# Patient Record
Sex: Female | Born: 1935 | Race: White | Hispanic: No | Marital: Single | Smoking: Never smoker
Health system: Southern US, Community
[De-identification: ages and names within clinical notes are randomized; demographics above are authoritative.]

## PROBLEM LIST (undated history)

## (undated) DIAGNOSIS — E119 Type 2 diabetes mellitus without complications: Secondary | ICD-10-CM

## (undated) DIAGNOSIS — E785 Hyperlipidemia, unspecified: Secondary | ICD-10-CM

## (undated) DIAGNOSIS — I10 Essential (primary) hypertension: Secondary | ICD-10-CM

## (undated) DIAGNOSIS — L409 Psoriasis, unspecified: Secondary | ICD-10-CM

## (undated) HISTORY — PX: CHOLECYSTECTOMY: SHX55

## (undated) HISTORY — PX: ABDOMINAL HYSTERECTOMY: SHX81

## (undated) HISTORY — PX: APPENDECTOMY: SHX54

---

## 2004-09-22 ENCOUNTER — Ambulatory Visit: Payer: Self-pay | Admitting: Internal Medicine

## 2004-11-28 ENCOUNTER — Other Ambulatory Visit: Payer: Self-pay

## 2004-11-28 ENCOUNTER — Emergency Department: Payer: Self-pay | Admitting: Emergency Medicine

## 2005-10-21 ENCOUNTER — Ambulatory Visit: Payer: Self-pay | Admitting: Internal Medicine

## 2006-12-22 ENCOUNTER — Ambulatory Visit: Payer: Self-pay | Admitting: Internal Medicine

## 2007-06-09 ENCOUNTER — Ambulatory Visit: Payer: Self-pay | Admitting: Vascular Surgery

## 2008-01-18 ENCOUNTER — Ambulatory Visit: Payer: Self-pay | Admitting: Internal Medicine

## 2009-01-15 ENCOUNTER — Ambulatory Visit: Payer: Self-pay | Admitting: Ophthalmology

## 2009-01-29 ENCOUNTER — Ambulatory Visit: Payer: Self-pay | Admitting: Ophthalmology

## 2009-03-06 ENCOUNTER — Ambulatory Visit: Payer: Self-pay | Admitting: Internal Medicine

## 2009-03-18 ENCOUNTER — Ambulatory Visit: Payer: Self-pay | Admitting: Ophthalmology

## 2010-05-05 ENCOUNTER — Ambulatory Visit: Payer: Self-pay | Admitting: Internal Medicine

## 2011-06-16 ENCOUNTER — Ambulatory Visit: Payer: Self-pay | Admitting: Internal Medicine

## 2012-12-05 ENCOUNTER — Ambulatory Visit: Payer: Self-pay | Admitting: Internal Medicine

## 2012-12-21 LAB — URINALYSIS, COMPLETE
Blood: NEGATIVE
Nitrite: NEGATIVE
Ph: 6 (ref 4.5–8.0)
Protein: 30
RBC,UR: 1 /HPF (ref 0–5)
WBC UR: <1 /HPF (ref 0–5)

## 2012-12-21 LAB — CBC
HGB: 13.2 g/dL (ref 12.0–16.0)
MCH: 30.1 pg (ref 26.0–34.0)
MCHC: 33.8 g/dL (ref 32.0–36.0)
Platelet: 133 10*3/uL — ABNORMAL LOW (ref 150–440)
RBC: 4.37 10*6/uL (ref 3.80–5.20)
RDW: 14.3 % (ref 11.5–14.5)
WBC: 5.9 10*3/uL (ref 3.6–11.0)

## 2012-12-21 LAB — COMPREHENSIVE METABOLIC PANEL
Alkaline Phosphatase: 84 U/L (ref 50–136)
Anion Gap: 6 — ABNORMAL LOW (ref 7–16)
BUN: 24 mg/dL — ABNORMAL HIGH (ref 7–18)
Calcium, Total: 8.7 mg/dL (ref 8.5–10.1)
Co2: 26 mmol/L (ref 21–32)
Creatinine: 1.51 mg/dL — ABNORMAL HIGH (ref 0.60–1.30)
EGFR (African American): 33 — ABNORMAL LOW
Osmolality: 286 (ref 275–301)
Potassium: 4.2 mmol/L (ref 3.5–5.1)
Sodium: 140 mmol/L (ref 136–145)

## 2012-12-21 LAB — TROPONIN I: Troponin-I: <0.02 ng/mL

## 2012-12-22 LAB — CBC WITH DIFFERENTIAL/PLATELET
Basophil %: 0.5 %
Eosinophil %: 1.6 %
HCT: 35.8 % (ref 35.0–47.0)
HGB: 12.2 g/dL (ref 12.0–16.0)
Lymphocyte #: 0.8 10*3/uL — ABNORMAL LOW (ref 1.0–3.6)
Lymphocyte %: 11.6 %
MCV: 89 fL (ref 80–100)
Monocyte #: 0.3 x10 3/mm (ref 0.2–0.9)
Platelet: 153 10*3/uL (ref 150–440)
RBC: 4.02 10*6/uL (ref 3.80–5.20)

## 2012-12-22 LAB — BASIC METABOLIC PANEL
Calcium, Total: 8.8 mg/dL (ref 8.5–10.1)
Co2: 27 mmol/L (ref 21–32)
Creatinine: 1.6 mg/dL — ABNORMAL HIGH (ref 0.60–1.30)
EGFR (African American): 31 — ABNORMAL LOW
EGFR (Non-African Amer.): 27 — ABNORMAL LOW
Glucose: 268 mg/dL — ABNORMAL HIGH (ref 65–99)
Osmolality: 290 (ref 275–301)

## 2012-12-23 LAB — CBC WITH DIFFERENTIAL/PLATELET
Basophil %: 1.1 %
HCT: 37.7 % (ref 35.0–47.0)
Lymphocyte #: 1.5 10*3/uL (ref 1.0–3.6)
Lymphocyte %: 21.6 %
MCH: 30.4 pg (ref 26.0–34.0)
MCHC: 34.4 g/dL (ref 32.0–36.0)
Monocyte #: 0.6 x10 3/mm (ref 0.2–0.9)
Neutrophil %: 63.7 %
Platelet: 158 10*3/uL (ref 150–440)
RDW: 14.1 % (ref 11.5–14.5)
WBC: 7 10*3/uL (ref 3.6–11.0)

## 2012-12-23 LAB — BASIC METABOLIC PANEL
Anion Gap: 7 (ref 7–16)
BUN: 25 mg/dL — ABNORMAL HIGH (ref 7–18)
Calcium, Total: 9 mg/dL (ref 8.5–10.1)
Creatinine: 1.59 mg/dL — ABNORMAL HIGH (ref 0.60–1.30)
Glucose: 143 mg/dL — ABNORMAL HIGH (ref 65–99)
Potassium: 4 mmol/L (ref 3.5–5.1)
Sodium: 141 mmol/L (ref 136–145)

## 2012-12-23 LAB — URINALYSIS, COMPLETE
Bilirubin,UR: NEGATIVE
Blood: NEGATIVE
Glucose,UR: >=500 mg/dL (ref 0–75)
Leukocyte Esterase: NEGATIVE
Ph: 5 (ref 4.5–8.0)
Protein: NEGATIVE
WBC UR: <1 /HPF (ref 0–5)

## 2012-12-24 ENCOUNTER — Inpatient Hospital Stay: Payer: Self-pay | Admitting: Internal Medicine

## 2012-12-24 LAB — BASIC METABOLIC PANEL
BUN: 29 mg/dL — ABNORMAL HIGH (ref 7–18)
Calcium, Total: 9 mg/dL (ref 8.5–10.1)
Chloride: 104 mmol/L (ref 98–107)
Co2: 30 mmol/L (ref 21–32)
EGFR (Non-African Amer.): 25 — ABNORMAL LOW
Osmolality: 289 (ref 275–301)
Potassium: 3.8 mmol/L (ref 3.5–5.1)

## 2012-12-24 LAB — CBC WITH DIFFERENTIAL/PLATELET
Basophil #: 0.1 10*3/uL (ref 0.0–0.1)
Basophil %: 1 %
Eosinophil #: 0.3 10*3/uL (ref 0.0–0.7)
Lymphocyte #: 1.3 10*3/uL (ref 1.0–3.6)
Lymphocyte %: 16.4 %
MCH: 30 pg (ref 26.0–34.0)
MCV: 88 fL (ref 80–100)
Monocyte #: 0.6 x10 3/mm (ref 0.2–0.9)
Monocyte %: 7.7 %
Neutrophil #: 5.7 10*3/uL (ref 1.4–6.5)

## 2012-12-25 LAB — BASIC METABOLIC PANEL
Anion Gap: 6 — ABNORMAL LOW (ref 7–16)
BUN: 32 mg/dL — ABNORMAL HIGH (ref 7–18)
Calcium, Total: 8.9 mg/dL (ref 8.5–10.1)
Creatinine: 1.86 mg/dL — ABNORMAL HIGH (ref 0.60–1.30)
EGFR (Non-African Amer.): 22 — ABNORMAL LOW
Glucose: 237 mg/dL — ABNORMAL HIGH (ref 65–99)
Osmolality: 287 (ref 275–301)
Potassium: 3.9 mmol/L (ref 3.5–5.1)

## 2012-12-26 ENCOUNTER — Encounter: Payer: Self-pay | Admitting: Internal Medicine

## 2013-01-22 ENCOUNTER — Encounter: Payer: Self-pay | Admitting: Internal Medicine

## 2013-08-13 ENCOUNTER — Ambulatory Visit: Payer: Self-pay | Admitting: Internal Medicine

## 2013-08-22 ENCOUNTER — Ambulatory Visit: Payer: Self-pay | Admitting: Internal Medicine

## 2013-09-21 ENCOUNTER — Ambulatory Visit: Payer: Self-pay | Admitting: Internal Medicine

## 2014-09-13 NOTE — Discharge Summary (Signed)
PATIENT NAME:  Morgan Howard, Morgan Howard MR#:  [id number] DATE OF BIRTH:  1915-01-05  DATE OF ADMISSION:  12/24/2012 DATE OF DISCHARGE:  12/26/2012   DISCHARGE DIAGNOSES:  1.  Acute vertigo.  2.  Malignant hypertension.  3.  Acute on chronic renal failure. 4.  Type 2 diabetes.  5.  Weakness.   CHIEF COMPLAINT: Dizziness associated with weakness and the patient fell back onto her bed.   HISTORY OF PRESENT ILLNESS: Morgan Howard is a 81 year old female, who presents to the ED complaining of feeling dizzy. She states that her head was spinning and she fell back onto the bed. She called her friend, who brought her to the Emergency Room. She continued to feel dizzy associated with nausea and vomiting. The patient's blood pressure on arrival to the ED was noted to be elevated at 221/105.   PAST MEDICAL HISTORY: Significant for type 2 diabetes, vertigo, hernia, hypertension, history of hepatitis B and chronic renal insufficiency.   PHYSICAL EXAMINATION:  VITAL SIGNS: Temperature 97.7, pulse 80, respirations 18, blood pressure 159/75, pulse oximetry 96% on room air.  GENERAL: She was not in respiratory distress.  HEENT: Pupils were equal and reactive to light.  NECK: No JVD.  HEART: S1, S2. A 2/6 systolic ejection murmur.  LUNGS: Clear.  ABDOMEN: Soft, nontender.  EXTREMITIES: No edema. Evidence of any petechial-looking rash noted to the left arm and left leg.   NEUROLOGIC: Nonfocal.   CT of the head was negative. WBC count 5.9, hemoglobin 13.2, hematocrit 38.9, platelets 133. Glucose 149, BUN 24, creatinine 1.51, sodium 140, potassium 4.2, chloride 108, CO2 26, calcium 8.7. LFTs were normal. Troponins were negative. GFR was 29. EKG shows sinus rhythm with PACs.   HOSPITAL COURSE: The patient was [facility]. She was started on low-dose Antivert. During her stay in the hospital, she did have an elevated blood sugars and she also had episodic dizziness, especially when she stood up and was noted  to be posturally hypotensive. She did have some worsening renal function with creatinine going up to 1.86, but overall appeared to be clinically stable and it was felt that she would benefit from rehab and she was discharged in stable condition to rehab on the following medications.   DISCHARGE MEDICATIONS: Amlodipine 5 mg once a day, Sitagliptin 50 mg once a day, meclizine 12.5 mg every 8 hours p.Howard.n.,  glipizide 5 mg p.o. b.i.d., doxazosin 2 mg once a day, aspirin 81 mg a day and Tylenol 325 mg q.4 to 6 hours p.Howard.n.   TOTAL TIME SPENT IN DISCHARGING THIS PATIENT: 35 minutes.  ____________________________ Barbette ReichmannVishwanath Wilhelmina Hark, MD vh:aw D: 12/25/2012 08:29:41 ET T: 12/25/2012 08:51:01 ET JOB#: [id number]  cc: Barbette ReichmannVishwanath Jasaiah Karwowski, MD, <Dictator> Barbette ReichmannVISHWANATH Akacia Boltz MD ELECTRONICALLY SIGNED 01/01/2013 18:39

## 2014-09-13 NOTE — H&P (Signed)
PATIENT NAME:  Morgan Howard, Morgan Howard MR#:  [id number] DATE OF BIRTH:  [birth year]  DATE OF ADMISSION:  12/21/2012  PRIMARY CARE PHYSICIAN:  Barbette Reichmann, MD  CHIEF COMPLAINT: When she got up this morning room was spinning and she fell back into the bed.   HISTORY OF PRESENT ILLNESS: This is a 81 year old female who lives alone, history of vertigo. This morning she woke up, tried to get up, felt very dizzy, the room was spinning and she fell back into the bed. She called her friend and brought her here. She is still feeling dizzy. She did have some nausea and vomiting this morning.  In the ER, as per the ER physician, was unable to get up and walk. Since she lives alone, it was advised that we brought her in for an observation. In the ER, she had a CT scan of the head that was negative, found to have a platelet count of 133 and a creatinine elevated at 1.51.  The patient's blood pressure was found to be elevated on presentation at 221/105. Hospitalist services were contacted for further evaluation.   PAST MEDICAL HISTORY: Diabetes, vertigo, hernia, hypertension, hepatitis B, rash on the arm.   PAST SURGICAL HISTORY: Cataract, hysterectomy, tonsils, appendix.   ALLERGIES: ASPIRIN, HIGH DOSE LIPITOR, NOVOCAIN, PENICILLIN, STATIN, SULFA.   MEDICATIONS: As per prescription writer include: 1.  Aspirin 81 mg daily. 2.  Ciclopirox 8% topical solution apply to effected area daily. 3.  Cyclobenzaprine 5 mg at bedtime as needed.  4.  Doxazosin 2 mg daily. 5.  Glipizide 10 mg twice a day. 6.  Dyazide 25/37.5 mg 1 tablet daily. 7.  Januvia 100 mg daily. 8.  Metformin 500 mg 2 tablets twice a day.   SOCIAL HISTORY: Lives alone. No alcohol. No smoking. No drug use. 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  FAMILY HISTORY: Father died at 109 of heart disease. Mother died at 33 of a CVA, also had hypertension.  REVIEW OF SYSTEMS: CONSTITUTIONAL: Positive for  sweats. No fever or chills. Positive for fatigue.  EYES: She has 1 good eye and she does wear reading glasses.  EARS, NOSE, MOUTH, AND THROAT: Decreased hearing. Positive for runny nose. No sore throat. No difficulty swallowing.  CARDIOVASCULAR: Occasional chest pain. No palpitations.  RESPIRATORY: No shortness of breath, but a chronic cough. No sputum. No hemoptysis.  GASTROINTESTINAL: Positive for nausea and vomiting this morning. Some lower abdominal pain. Occasional diarrhea. She does see bright red blood occasionally with hemorrhoids. GENITOURINARY: Occasional burning on urination.  INTEGUMENT: Has this rash on various places, petechial looking.  NEUROLOGIC: No fainting or blackouts, but dizziness today and vertigo.  PSYCHIATRIC: Positive for anxiety.  ENDOCRINE: No thyroid problems.  HEMATOLOGIC/LYMPHATIC: No anemia, but thrombocytopenia noted on labs today.   PHYSICAL EXAMINATION:  VITAL SIGNS: Current include a temperature of 97.7, pulse 80, respirations 18, blood pressure 159/75 and pulse ox 96% on room air. Blood pressure on presentation with the acute vertigo was 221/105.  GENERAL: No respiratory distress.  EYES: Conjunctivae and lids normal. Pupils equal, round and reactive to light. Extraocular muscles intact. No nystagmus.  EARS, NOSE, MOUTH AND THROAT: Tympanic membranes: No erythema. Nasal mucosa: No erythema. Throat:  No erythema. no exudate seen. Lips and gums: No lesions.  NECK: No JVD. No bruits. No lymphadenopathy. No thyromegaly. No thyroid nodules palpated.  LUNGS: Clear to auscultation. No use of accessory muscles to breathe. No rhonchi, rales or wheeze  heard.  HEART: S1 and  S2 normal. Positive 2/6 systolic ejection murmur. Carotid upstroke 2+ bilaterally. No bruits.  EXTREMITIES: Dorsalis pedis pulses 2+ bilaterally. No edema of the lower extremities.  ABDOMEN: Soft, nontender. No organosplenomegaly. Normoactive bowel sounds. No masses felt.  LYMPHATIC: No lymph nodes  in the neck.  MUSCULOSKELETAL: No clubbing, edema or cyanosis.  SKIN: No ulcers seen. Positive for rash, left arm and left leg, petechial-looking rash.  NEUROLOGICAL: Cranial nerves II through XII grossly intact. Deep tendon reflexes 2+ bilateral lower extremities. Power 5/5 in upper and lower extremities.  PSYCHIATRIC: The patient is oriented to person, place and time.   LABORATORY AND DIAGNOSTICS: Urinalysis:  30 mg/dL protein, 528150 mg/dL glucose.   CT scan of the head: Negative.   White blood cell count 5.9, H and H 13.2 and 38.9, platelet count 133. Glucose 149, BUN 24, creatinine 1.51, sodium 140, potassium 4.2, chloride 108, CO2 26, calcium 8.7. Liver function tests normal range. GFR 29. Troponin negative.   EKG: Sinus rhythm, premature atrial complexes.   ASSESSMENT AND PLAN: 1.  Acute vertigo with room spinning. Will give gentle IV fluid, get physical therapy evaluation, low-dose Antivert around-the-clock.  Will get a physical therapy evaluation to ensure that she is safe upon going home. We will admit as an observation.  2.  Acute renal failure versus chronic kidney disease. No prior creatinine to go by. I will give gentle IV fluid hydration. Glucophage is contraindicated. With a GFR of 29 will stop that medication. Will check a creatinine in the a.m.  3.  Malignant hypertension.  Will continue usual medications and continue to monitor. Likely was elevated secondary to the vertigo upon presentation.  4.  Diabetes.  Need to renally dose Januvia down to 50 mg daily. I will decrease the glipizide to 5 mg twice a day and DC metformin.  5.  Thrombocytopenia. Unclear if this is new. I will check a hepatitis C.  6.  Petechial type looking rash on the arms.  I am wondering if this could be secondary to the thrombocytopenia or possibly a leukoclastic vasculitis. I will send off a hepatitis C, cryoglobulin, ANA, sedimentation rate, and continue to monitor. The patient is on a solution as per the  dermatologist. May end up needing an outpatient biopsy/  CODE STATUS: The patient is a DNR.   TIME SPENT ON ADMISSION: 55 minutes.   ___________________________ Herschell Dimesichard J. Renae GlossWieting, MD rjw:sb D: 12/21/2012 12:48:35 ET T: 12/21/2012 12:59:23 ET JOB#: [id number]  cc: Herschell Dimesichard J. Renae GlossWieting, MD, <Dictator> Barbette ReichmannVishwanath Hande, MD Salley ScarletICHARD J Reizel Calzada MD ELECTRONICALLY SIGNED 01/05/2013 16:32

## 2014-09-13 NOTE — Discharge Summary (Signed)
PATIENT NAME:  Morgan Howard, Morgan Howard MR#:  [id number] DATE OF BIRTH:  July 30, 1914  DATE OF ADMISSION:  12/21/12 DATE OF DISCHARGE: 12/26/12  ADDENDUM:    The patient is the patient was [facility] in [facility].   DISCHARGE MEDICATIONS: As above.    ____________________________ Barbette ReichmannVishwanath Burwell Bethel, MD vh:cc D: 12/26/2012 15:27:00 ET T: 12/26/2012 16:20:31 ET JOB#: [id number]  cc: Barbette ReichmannVishwanath Gordan Grell, MD, <Dictator> Barbette ReichmannVISHWANATH Skylar Flynt MD ELECTRONICALLY SIGNED 01/01/2013 18:45

## 2016-01-01 ENCOUNTER — Observation Stay
Admission: EM | Admit: 2016-01-01 | Discharge: 2016-01-02 | Disposition: A | Discharge status: Home / Self Care | Payer: Medicare Other | Attending: Internal Medicine | Admitting: Internal Medicine

## 2016-01-01 ENCOUNTER — Encounter: Payer: Self-pay | Admitting: Emergency Medicine

## 2016-01-01 ENCOUNTER — Emergency Department: Payer: Medicare Other

## 2016-01-01 DIAGNOSIS — E11649 Type 2 diabetes mellitus with hypoglycemia without coma: Secondary | ICD-10-CM | POA: Diagnosis not present

## 2016-01-01 DIAGNOSIS — Z880 Allergy status to penicillin: Secondary | ICD-10-CM | POA: Insufficient documentation

## 2016-01-01 DIAGNOSIS — Z794 Long term (current) use of insulin: Secondary | ICD-10-CM | POA: Insufficient documentation

## 2016-01-01 DIAGNOSIS — E118 Type 2 diabetes mellitus with unspecified complications: Secondary | ICD-10-CM

## 2016-01-01 DIAGNOSIS — E119 Type 2 diabetes mellitus without complications: Secondary | ICD-10-CM

## 2016-01-01 DIAGNOSIS — Z7982 Long term (current) use of aspirin: Secondary | ICD-10-CM | POA: Diagnosis not present

## 2016-01-01 DIAGNOSIS — I700 Atherosclerosis of aorta: Secondary | ICD-10-CM | POA: Insufficient documentation

## 2016-01-01 DIAGNOSIS — E785 Hyperlipidemia, unspecified: Secondary | ICD-10-CM | POA: Insufficient documentation

## 2016-01-01 DIAGNOSIS — I10 Essential (primary) hypertension: Secondary | ICD-10-CM | POA: Insufficient documentation

## 2016-01-01 DIAGNOSIS — E162 Hypoglycemia, unspecified: Secondary | ICD-10-CM | POA: Diagnosis present

## 2016-01-01 DIAGNOSIS — L409 Psoriasis, unspecified: Secondary | ICD-10-CM | POA: Insufficient documentation

## 2016-01-01 DIAGNOSIS — R1111 Vomiting without nausea: Secondary | ICD-10-CM | POA: Diagnosis present

## 2016-01-01 DIAGNOSIS — K219 Gastro-esophageal reflux disease without esophagitis: Secondary | ICD-10-CM | POA: Diagnosis not present

## 2016-01-01 DIAGNOSIS — R1110 Vomiting, unspecified: Secondary | ICD-10-CM | POA: Diagnosis present

## 2016-01-01 DIAGNOSIS — R112 Nausea with vomiting, unspecified: Secondary | ICD-10-CM | POA: Diagnosis present

## 2016-01-01 DIAGNOSIS — G319 Degenerative disease of nervous system, unspecified: Secondary | ICD-10-CM | POA: Insufficient documentation

## 2016-01-01 DIAGNOSIS — Z66 Do not resuscitate: Secondary | ICD-10-CM | POA: Insufficient documentation

## 2016-01-01 HISTORY — DX: Essential (primary) hypertension: I10

## 2016-01-01 HISTORY — DX: Psoriasis, unspecified: L40.9

## 2016-01-01 HISTORY — DX: Hyperlipidemia, unspecified: E78.5

## 2016-01-01 HISTORY — DX: Type 2 diabetes mellitus without complications: E11.9

## 2016-01-01 LAB — COMPREHENSIVE METABOLIC PANEL
ALBUMIN: 4.2 g/dL (ref 3.5–5.0)
ALK PHOS: 76 U/L (ref 38–126)
ALT: 9 U/L — AB (ref 14–54)
ANION GAP: 9 (ref 5–15)
AST: 17 U/L (ref 15–41)
BILIRUBIN TOTAL: 0.4 mg/dL (ref 0.3–1.2)
BUN: 21 mg/dL — ABNORMAL HIGH (ref 6–20)
CALCIUM: 9.2 mg/dL (ref 8.9–10.3)
CO2: 23 mmol/L (ref 22–32)
CREATININE: 1.47 mg/dL — AB (ref 0.44–1.00)
Chloride: 109 mmol/L (ref 101–111)
GFR calc non Af Amer: 28 mL/min — ABNORMAL LOW (ref >60)
GFR, EST AFRICAN AMERICAN: 33 mL/min — AB (ref >60)
GLUCOSE: 89 mg/dL (ref 65–99)
Potassium: 4.4 mmol/L (ref 3.5–5.1)
Sodium: 141 mmol/L (ref 135–145)
TOTAL PROTEIN: 7.5 g/dL (ref 6.5–8.1)

## 2016-01-01 LAB — URINALYSIS COMPLETE WITH MICROSCOPIC (ARMC ONLY)
BACTERIA UA: NONE SEEN
Bilirubin Urine: NEGATIVE
Glucose, UA: 50 mg/dL — AB
Ketones, ur: NEGATIVE mg/dL
LEUKOCYTES UA: NEGATIVE
Nitrite: NEGATIVE
PH: 5 (ref 5.0–8.0)
PROTEIN: 100 mg/dL — AB
Specific Gravity, Urine: 1.016 (ref 1.005–1.030)
WBC UA: NONE SEEN WBC/hpf (ref 0–5)

## 2016-01-01 LAB — CBC
HCT: 37 % (ref 35.0–47.0)
HEMOGLOBIN: 12.4 g/dL (ref 12.0–16.0)
MCH: 29.7 pg (ref 26.0–34.0)
MCHC: 33.5 g/dL (ref 32.0–36.0)
MCV: 88.7 fL (ref 80.0–100.0)
Platelets: 171 10*3/uL (ref 150–440)
RBC: 4.17 MIL/uL (ref 3.80–5.20)
RDW: 14.3 % (ref 11.5–14.5)
WBC: 8.9 10*3/uL (ref 3.6–11.0)

## 2016-01-01 LAB — GLUCOSE, CAPILLARY
GLUCOSE-CAPILLARY: 81 mg/dL (ref 65–99)
Glucose-Capillary: 110 mg/dL — ABNORMAL HIGH (ref 65–99)

## 2016-01-01 LAB — TROPONIN I

## 2016-01-01 LAB — LIPASE, BLOOD: Lipase: 56 U/L — ABNORMAL HIGH (ref 11–51)

## 2016-01-01 MED ORDER — LABETALOL HCL 5 MG/ML IV SOLN
5.0000 mg | Freq: Once | INTRAVENOUS | Status: AC
  Administered 2016-01-01: 5 mg via INTRAVENOUS
  Filled 2016-01-01: qty 4

## 2016-01-01 MED ORDER — ONDANSETRON HCL 4 MG/2ML IJ SOLN
4.0000 mg | Freq: Once | INTRAMUSCULAR | Status: AC
  Administered 2016-01-01: 4 mg via INTRAVENOUS

## 2016-01-01 MED ORDER — ONDANSETRON HCL 4 MG/2ML IJ SOLN
4.0000 mg | Freq: Once | INTRAMUSCULAR | Status: AC
  Administered 2016-01-01: 4 mg via INTRAVENOUS
  Filled 2016-01-01: qty 2

## 2016-01-01 NOTE — ED Notes (Signed)
Pt alert.  Pt states feeling better. Skin warm and dry.  Pt talkative.  Sinus on monitor.

## 2016-01-01 NOTE — ED Notes (Signed)
Pt vomited large amount of emesis.  meds given.  md aware.

## 2016-01-01 NOTE — ED Provider Notes (Signed)
[facility] Emergency Department Provider Note ____________________________________________  Time seen: I have reviewed the triage vital signs and the triage nursing note.  HISTORY  Chief Complaint Hypoglycemia   Historian Patient and one of her caregivers, a lifelong friend who cares for her along with the caregiver's daughter, and stays with this patient many nights  HPI Morgan Howard is a 82 y.o. female with a history of hypertension, and diabetes, for which takes Januvia pill, and reportedly take Humalog 10 unitstwice a day, as well as Lantus 26 units at night, is here for hyperglycemia.  The caregiver's daughter who stays with this patient during the day stated that she did not eat much for lunch and the patient did receive her 10 unitsof Humalogaround 2:30 when she left. When this caregiver showed up around 4, the patient was complaining of feeling jittery and not well and when the blood sugar was checked it was in the 50s. The patient was given orange juice followed by Peanut butter crackers, and her blood sugar did come up to 160.  The patient states she is still a little bit jittery here, but there is no confusion or altered mental status. There is been no chest pain. There is been no cough. No vomiting or diarrhea. No abdominal pain. Caregiver states that she offered tomato soup and she did not eat that. They called the primary care physcian's office, Dr. Marcello Fennel, who recommended Lantus 20 units tonight, instead of 26 units.   History reviewed. No pertinent past medical history.iabetes, hypertension  There are no active problems to display for this patient.   History reviewed. No pertinent surgical history.  Prior to Admission medications   Not on File  aspirin, losartan, Januvia, Humalog, Lantus  No Known Allergies  No family history on file.  Social History Social History  Substance Use Topics  . Smoking status: Never Smoker  . Smokeless  tobacco: Never Used  . Alcohol use No    Review of Systems  Constitutional: Negative for fever. Eyes: Negative for visual changes. ENT: Negative for sore throat. Cardiovascular: Negative for chest pain. Respiratory: Negative for shortness of breath. Gastrointestinal: Negative for abdominal pain, vomiting and diarrhea. Genitourinary: Questionable dysuria. Musculoskeletal: Negative for back pain. Skin: Negative for rash. Neurological: Negative for headache, seizure, confusion, weakness, numbness 10 point Review of Systems otherwise negative ____________________________________________   PHYSICAL EXAM:  VITAL SIGNS: ED Triage Vitals  Enc Vitals Group     BP 01/01/16 1822 138/60     Pulse Rate 01/01/16 1822 (!) 13     Resp 01/01/16 1822 20     Temp 01/01/16 1822 97.8 F (36.6 C)     Temp Source 01/01/16 1822 Oral     SpO2 01/01/16 1822 97 %     Weight 01/01/16 1816 135 lb (61.2 kg)     Height 01/01/16 1816  (1.473 m)     Head Circumference --      Peak Flow --      Pain Score --      Pain Loc --      Pain Edu? --      Excl. in GC? --      Constitutional: Alert and oriented. Well appearing and in no distress. HEENT   Head: Normocephalic and atraumatic.      Eyes: Conjunctivae are normal. PERRL. Normal extraocular movements.      Ears:         Nose: No congestion/rhinnorhea.   Mouth/Throat: Mucous membranes  are moist.   Neck: No stridor. Cardiovascular/Chest: Normal rate, regular rhythm.  No murmurs, rubs, or gallops. Respiratory: Normal respiratory effort without tachypnea nor retractions. Breath sounds are clear and equal bilaterally. No wheezes/rales/rhonchi. Gastrointestinal: Soft. No distention, no guarding, no rebound. Nontender.   Genitourinary/rectal:Deferred Musculoskeletal: Nontender with normal range of motion in all extremities. No joint effusions.  No lower extremity tenderness.  No edema. Neurologic:  Normal speech and language. No gross  or focal neurologic deficits are appreciated. Skin:  Skin is warm, dry and intact. No rash noted. Psychiatric: Mood and affect are normal. Speech and behavior are normal. Patient exhibits appropriate insight and judgment.  ____________________________________________   EKG I, Governor Rooks, MD, the attending physician have personally viewed and interpreted all ECGs.  91 bpm. Sinus rhythm with PACs. Narrow QRS. Normal axis. Normal ST and T-wave ____________________________________________  LABS (pertinent positives/negatives)  Labs Reviewed  LIPASE, BLOOD - Abnormal; Notable for the following:       Result Value   Lipase 56 (*)    All other components within normal limits  COMPREHENSIVE METABOLIC PANEL - Abnormal; Notable for the following:    BUN 21 (*)    Creatinine, Ser 1.47 (*)    ALT 9 (*)    GFR calc non Af Amer 28 (*)    GFR calc Af Amer 33 (*)    All other components within normal limits  GLUCOSE, CAPILLARY - Abnormal; Notable for the following:    Glucose-Capillary 110 (*)    All other components within normal limits  GLUCOSE, CAPILLARY  CBC  URINALYSIS COMPLETEWITH MICROSCOPIC ([facility] ONLY)  TROPONIN I  CBG MONITORING, ED    ____________________________________________  RADIOLOGY All Xrays were viewed by me. Imaging interpreted by Radiologist.  Chest portable xray: IMPRESSION: No acute disease.  Atherosclerosis.  CT head without contrast:  IMPRESSION: No acute abnormality.  Atrophy and chronic microvascular ischemic change.   Abdomen xray: Evaluated by me, awaiting radiologist official report: Nonobstructive bowel gas pattern. __________________________________________  PROCEDURES  Procedure(s) performed: None  Critical Care performed: None  ____________________________________________   ED COURSE / ASSESSMENT AND PLAN  Pertinent labs & imaging results that were available during my care of the patient were reviewed by me and considered in my  medical decision making (see chart for details).   Ms. Wible is here after symptomatic hypoglycemic episode this afternoon. It sounds perhaps the onset was the fact that she did not eat well this afternoon. She is not denying neurologic symptoms, headache, fever, abdominal pain, and until she vomited here in the ER, They did not state that she had several episodes of emesis at home. Nonbloody and nonbilious emesis. Denies diarrhea.   However, after large emesis here, I'm suspicious that at minimum she may have a virus that's giving her decreased by mouth intake with resultant hypoglycemia, but I am going to broaden the evaluation to include a head CT given her elevated blood pressures here despite reporting that she did not miss her morning blood pressure medication.  I am also going to add on EKG, chest x-ray, troponin, and urinalysis which has not been obtained yet.  Additional laboratory studies show no abnormalities on EKG, negative troponin, negative chest x-ray, negative head CT, and no evidence of urinary tract infection.  However, given multiple episodes of vomiting in this elderly patient with an episode of hypoglycemia, I am quite concerned that she will not be able to keep food down and will become hypoglycemic with symptoms again. Patient needs  observation overnight.  She just had one episode of emesis here and has nontender abdomen. At this point I am not concerned obstruction, or other acute intra-abdominal emergency.  My view of abdomen x-ray is nonspecific, no high concern for something that would require additional imaging. Radiologist report is pending at time of hospitalist consultation for admission.    CONSULTATIONS:   Hospitalist for admission.   Patient / Family / Caregiver informed of clinical course, medical decision-making process, and agree with plan.   ___________________________________________   FINAL CLINICAL IMPRESSION(S) / ED DIAGNOSES   Final  diagnoses:  Hypoglycemia  Non-intractable vomiting without nausea, vomiting of unspecified type              Note: This dictation was prepared with Dragon dictation. Any transcriptional errors that result from this process are unintentional    Governor Rooksebecca Latoya Diskin, MD 01/01/16 2055

## 2016-01-01 NOTE — ED Triage Notes (Signed)
Pt presents with low blood sugar today. pts cbg was 53 earlier today and caregiver gave OJ, gingerale, crackers and peanut butter. Pt a/ox3 in triage. cbg in triage was 81.

## 2016-01-01 NOTE — ED Notes (Signed)
fsbs 110  md aware.  Friend with pt.

## 2016-01-01 NOTE — H&P (Signed)
[facility] - [place] at [facility]   PATIENT NAME: Morgan Howard    MR#:  [id number]  DATE OF BIRTH:  Jul 13, 1914  DATE OF ADMISSION:  01/01/2016  PRIMARY CARE PHYSICIAN: Barbette Reichmann, MD   REQUESTING/REFERRING PHYSICIAN: Shaune Pollack, MD  CHIEF COMPLAINT:   Chief Complaint  Patient presents with  . Hypoglycemia    HISTORY OF PRESENT ILLNESS:  Morgan Howard  is a 82 y.o. female who presents with Progressive hypoglycemia followed by significant nausea and vomiting. Patient states that her blood sugar dropped to the 50s at home today. She had never had an episode of hypoglycemia like this before. She had significant associated symptoms which she described as some diaphoresis and "the shakes".  She was treated by EMS and her blood sugar improved. However, she had persistent nausea with vomiting even here in the ED after antiemetic administration. Hospitals were called for admission for observation.  PAST MEDICAL HISTORY:   Past Medical History:  Diagnosis Date  . Diabetes (HCC)   . HLD (hyperlipidemia)   . HTN (hypertension)   . Psoriasis     PAST SURGICAL HISTORY:   Past Surgical History:  Procedure Laterality Date  . ABDOMINAL HYSTERECTOMY    . APPENDECTOMY    . CHOLECYSTECTOMY      SOCIAL HISTORY:   Social History  Substance Use Topics  . Smoking status: Never Smoker  . Smokeless tobacco: Never Used  . Alcohol use No    FAMILY HISTORY:   Family History  Problem Relation Age of Onset  . Stroke Mother   . Heart attack Father   . Breast cancer Sister     DRUG ALLERGIES:   Allergies  Allergen Reactions  . Penicillins     Has patient had a PCN reaction causing immediate rash, facial/tongue/throat swelling, SOB or lightheadedness with hypotension: No Has patient had a PCN reaction causing severe rash involving mucus membranes or skin necrosis: No Has patient had a PCN reaction that required hospitalization No Has patient had a PCN  reaction occurring within the last 10 years: No If all of the above answers are "NO", then may proceed with Cephalosporin use.    MEDICATIONS AT HOME:   Prior to Admission medications   Medication Sig Start Date End Date Taking? Authorizing Provider  acetaminophen (TYLENOL) 500 MG tablet Take 500 mg by mouth daily.   Yes Historical Provider, MD  aspirin EC 81 MG tablet Take 81 mg by mouth daily.   Yes Historical Provider, MD  Cyanocobalamin 1500 MCG TBDP Take 1,500 mcg by mouth daily.   Yes Historical Provider, MD  docusate sodium (COLACE) 50 MG capsule Take 50 mg by mouth daily.   Yes Historical Provider, MD  doxazosin (CARDURA) 2 MG tablet Take 2 mg by mouth daily.   Yes Historical Provider, MD  ferrous sulfate 325 (65 FE) MG tablet Take 325 mg by mouth daily with breakfast.   Yes Historical Provider, MD  Insulin Glargine (LANTUS SOLOSTAR) 100 UNIT/ML Solostar Pen Inject 26 Units into the skin at bedtime.   Yes Historical Provider, MD  insulin lispro (HUMALOG) 100 UNIT/ML KiwkPen Inject 10 Units into the skin 2 (two) times daily with a meal.   Yes Historical Provider, MD  losartan (COZAAR) 25 MG tablet Take 25 mg by mouth daily.   Yes Historical Provider, MD  omeprazole (PRILOSEC) 20 MG capsule Take 20 mg by mouth daily.   Yes Historical Provider, MD  rOPINIRole (REQUIP) 0.5 MG tablet Take 0.5 mg by  mouth 2 (two) times daily.   Yes Historical Provider, MD  sitaGLIPtin (JANUVIA) 25 MG tablet Take 25 mg by mouth daily.   Yes Historical Provider, MD    REVIEW OF SYSTEMS:  Review of Systems  Constitutional: Negative for chills, fever, malaise/fatigue and weight loss.  HENT: Negative for ear pain, hearing loss and tinnitus.   Eyes: Negative for blurred vision, double vision, pain and redness.  Respiratory: Negative for cough, hemoptysis and shortness of breath.   Cardiovascular: Negative for chest pain, palpitations, orthopnea and leg swelling.  Gastrointestinal: Positive for nausea and  vomiting. Negative for abdominal pain, constipation and diarrhea.  Genitourinary: Negative for dysuria, frequency and hematuria.  Musculoskeletal: Negative for back pain, joint pain and neck pain.  Skin:       No acne, rash, or lesions  Neurological: Negative for dizziness, tremors, focal weakness and weakness.  Endo/Heme/Allergies: Negative for polydipsia. Does not bruise/bleed easily.  Psychiatric/Behavioral: Negative for depression. The patient is not nervous/anxious and does not have insomnia.      VITAL SIGNS:   Vitals:   01/01/16 2030 01/01/16 2045 01/01/16 2130 01/01/16 2205  BP: (!) 146/81  (!) 210/89 (!) 194/89  Pulse: (!) 106 (!) 102  (!) 109  Resp: (!) 23 16 (!) 23 (!) 23  Temp:      TempSrc:      SpO2: 98% 99%  98%  Weight:      Height:       Wt Readings from Last 3 Encounters:  01/01/16 61.2 kg (135 lb)    PHYSICAL EXAMINATION:  Physical Exam  Vitals reviewed. Constitutional: She is oriented to person, place, and time. She appears well-developed and well-nourished. No distress.  HENT:  Head: Normocephalic and atraumatic.  Mouth/Throat: Oropharynx is clear and moist.  Eyes: Conjunctivae and EOM are normal. Pupils are equal, round, and reactive to light. No scleral icterus.  Neck: Normal range of motion. Neck supple. No JVD present. No thyromegaly present.  Cardiovascular: Normal rate and intact distal pulses.  Exam reveals no gallop and no friction rub.   No murmur heard. Irregular rhythm  Respiratory: Effort normal and breath sounds normal. No respiratory distress. She has no wheezes. She has no rales.  GI: Soft. Bowel sounds are normal. She exhibits no distension. There is no tenderness.  Musculoskeletal: Normal range of motion. She exhibits no edema.  No arthritis, no gout  Lymphadenopathy:    She has no cervical adenopathy.  Neurological: She is alert and oriented to person, place, and time. No cranial nerve deficit.  No dysarthria, no aphasia  Skin:  Skin is warm and dry. No rash noted. No erythema.  Psychiatric: She has a normal mood and affect. Her behavior is normal. Judgment and thought content normal.    LABORATORY PANEL:   CBC  Recent Labs Lab 01/01/16 1821  WBC 8.9  HGB 12.4  HCT 37.0  PLT 171   ------------------------------------------------------------------------------------------------------------------  Chemistries   Recent Labs Lab 01/01/16 1821  NA 141  K 4.4  CL 109  CO2 23  GLUCOSE 89  BUN 21*  CREATININE 1.47*  CALCIUM 9.2  AST 17  ALT 9*  ALKPHOS 76  BILITOT 0.4   ------------------------------------------------------------------------------------------------------------------  Cardiac Enzymes  Recent Labs Lab 01/01/16 1821  TROPONINI <0.03   ------------------------------------------------------------------------------------------------------------------  RADIOLOGY:  Ct Head Wo Contrast  Result Date: 01/01/2016 CLINICAL DATA:  Vomiting for 1 day. The patient does not feel normal. Hypoglycemia. EXAM: CT HEAD WITHOUT CONTRAST TECHNIQUE: Contiguous axial images were  obtained from the base of the skull through the vertex without intravenous contrast. COMPARISON:  Head CT scan 12/21/2012. FINDINGS: Atrophy and chronic microvascular ischemic change are seen. No evidence of acute intracranial abnormality including hemorrhage, infarct, mass lesion, mass effect, midline shift or abnormal extra-axial fluid collection. No hydrocephalus pneumocephalus. The calvarium is intact. Imaged paranasal sinuses and mastoid air cells are clear. IMPRESSION: No acute abnormality. Atrophy and chronic microvascular ischemic change. Electronically Signed   By: Drusilla Kanner M.D.   On: 01/01/2016 20:18   Dg Chest Port 1 View  Result Date: 01/01/2016 CLINICAL DATA:  Decreased blood sugar today. EXAM: PORTABLE CHEST 1 VIEW COMPARISON:  None. FINDINGS: Lungs are clear. Heart size is normal. No pneumothorax or pleural  effusion. Aortic atherosclerosis is noted. IMPRESSION: No acute disease. Atherosclerosis. Electronically Signed   By: Drusilla Kanner M.D.   On: 01/01/2016 20:16   Dg Abd 2 Views  Result Date: 01/01/2016 CLINICAL DATA:  Nausea and vomiting today. EXAM: ABDOMEN - 2 VIEW COMPARISON:  None. FINDINGS: The bowel gas pattern is unremarkable. There is no evidence of bowel obstruction or pneumoperitoneum. No suspicious calcifications are present. Degenerative changes in the thoracic and lumbar spine and hips noted. IMPRESSION: No acute abnormality.  Unremarkable bowel gas pattern. Electronically Signed   By: Harmon Pier M.D.   On: 01/01/2016 21:04    EKG:   Orders placed or performed during the hospital encounter of 01/01/16  . EKG 12-Lead  . EKG 12-Lead    IMPRESSION AND PLAN:  Principal Problem:   Hypoglycemia - blood sugars responded well to treatment by EMS and food here in the ED. We will admit her for observation tonight. Serial glucose checks Active Problems:   Diabetes (HCC) - hold her insulin tonight, monitor her blood sugar was serial glucose checks, check hemoglobin A1c.   Vomiting - when necessary antiemetics   GERD (gastroesophageal reflux disease) - home dose PPI  All the records are reviewed and case discussed with ED provider. Management plans discussed with the patient and/or family.  DVT PROPHYLAXIS: SubQ lovenox  GI PROPHYLAXIS: PPI  ADMISSION STATUS: Observation  CODE STATUS: DNR Code Status History    This patient does not have a recorded code status. Please follow your organizational policy for patients in this situation.      TOTAL TIME TAKING CARE OF THIS PATIENT: 40 minutes.    Carla Rashad FIELDING 01/01/2016, 11:46 PM  [facility]  Office  [phone]  CC: Primary care physician; Barbette Reichmann, MD

## 2016-01-01 NOTE — ED Notes (Signed)
Pt to room via wheelchair.  Pt reports vomiting x 1 today.  Denies abd pain.  No diarrhea. Pt states she doesn't fill right today.  Blood sugar was low earlier today, pt drank and ate and blood sugar returned to normal.  No chest pain or sob.  Pt alert. Speech clear.

## 2016-01-02 LAB — BASIC METABOLIC PANEL
Anion gap: 9 (ref 5–15)
BUN: 20 mg/dL (ref 6–20)
CALCIUM: 9.1 mg/dL (ref 8.9–10.3)
CHLORIDE: 104 mmol/L (ref 101–111)
CO2: 26 mmol/L (ref 22–32)
CREATININE: 1.43 mg/dL — AB (ref 0.44–1.00)
GFR calc non Af Amer: 29 mL/min — ABNORMAL LOW (ref >60)
GFR, EST AFRICAN AMERICAN: 34 mL/min — AB (ref >60)
Glucose, Bld: 253 mg/dL — ABNORMAL HIGH (ref 65–99)
Potassium: 4.6 mmol/L (ref 3.5–5.1)
Sodium: 139 mmol/L (ref 135–145)

## 2016-01-02 LAB — CBC
HCT: 39.4 % (ref 35.0–47.0)
Hemoglobin: 13 g/dL (ref 12.0–16.0)
MCH: 29.1 pg (ref 26.0–34.0)
MCHC: 33 g/dL (ref 32.0–36.0)
MCV: 88.2 fL (ref 80.0–100.0)
PLATELETS: 183 10*3/uL (ref 150–440)
RBC: 4.47 MIL/uL (ref 3.80–5.20)
RDW: 14.2 % (ref 11.5–14.5)
WBC: 14.5 10*3/uL — ABNORMAL HIGH (ref 3.6–11.0)

## 2016-01-02 LAB — GLUCOSE, CAPILLARY
GLUCOSE-CAPILLARY: 199 mg/dL — AB (ref 65–99)
Glucose-Capillary: 183 mg/dL — ABNORMAL HIGH (ref 65–99)
Glucose-Capillary: 292 mg/dL — ABNORMAL HIGH (ref 65–99)
Glucose-Capillary: 274 mg/dL — ABNORMAL HIGH (ref 65–99)

## 2016-01-02 LAB — HEMOGLOBIN A1C: Hgb A1c MFr Bld: 7.8 % — ABNORMAL HIGH (ref 4.0–6.0)

## 2016-01-02 MED ORDER — INSULIN ASPART 100 UNIT/ML ~~LOC~~ SOLN
0.0000 [IU] | Freq: Three times a day (TID) | SUBCUTANEOUS | Status: DC
Start: 2016-01-02 — End: 2016-01-02
  Administered 2016-01-02: 5 [IU] via SUBCUTANEOUS
  Filled 2016-01-02: qty 5

## 2016-01-02 MED ORDER — ENOXAPARIN SODIUM 40 MG/0.4ML ~~LOC~~ SOLN: 40.0000 mg | SUBCUTANEOUS | Status: DC

## 2016-01-02 MED ORDER — PANTOPRAZOLE SODIUM 40 MG PO TBEC
40.0000 mg | DELAYED_RELEASE_TABLET | Freq: Every day | ORAL | Status: DC
  Administered 2016-01-02: 40 mg via ORAL
  Filled 2016-01-02: qty 1

## 2016-01-02 MED ORDER — ROPINIROLE HCL 0.25 MG PO TABS
0.5000 mg | ORAL_TABLET | Freq: Two times a day (BID) | ORAL | Status: DC
  Administered 2016-01-02: 0.5 mg via ORAL
  Filled 2016-01-02: qty 2

## 2016-01-02 MED ORDER — ONDANSETRON HCL 4 MG PO TABS: 4.0000 mg | ORAL_TABLET | Freq: Four times a day (QID) | ORAL | Status: DC | PRN

## 2016-01-02 MED ORDER — ENOXAPARIN SODIUM 30 MG/0.3ML ~~LOC~~ SOLN: 30.0000 mg | SUBCUTANEOUS | Status: DC

## 2016-01-02 MED ORDER — DOXAZOSIN MESYLATE 4 MG PO TABS
2.0000 mg | ORAL_TABLET | Freq: Every day | ORAL | Status: DC
  Administered 2016-01-02: 2 mg via ORAL
  Filled 2016-01-02: qty 1

## 2016-01-02 MED ORDER — ASPIRIN EC 81 MG PO TBEC
81.0000 mg | DELAYED_RELEASE_TABLET | Freq: Every day | ORAL | Status: DC
  Administered 2016-01-02: 81 mg via ORAL
  Filled 2016-01-02: qty 1

## 2016-01-02 MED ORDER — ACETAMINOPHEN 650 MG RE SUPP: 650.0000 mg | Freq: Four times a day (QID) | RECTAL | Status: DC | PRN

## 2016-01-02 MED ORDER — INSULIN GLARGINE 100 UNIT/ML SOLOSTAR PEN: 16.0000 [IU] | PEN_INJECTOR | Freq: Every day | SUBCUTANEOUS | 11 refills | Status: AC

## 2016-01-02 MED ORDER — ACETAMINOPHEN 325 MG PO TABS
650.0000 mg | ORAL_TABLET | Freq: Four times a day (QID) | ORAL | Status: DC | PRN
  Administered 2016-01-02: 650 mg via ORAL
  Filled 2016-01-02: qty 2

## 2016-01-02 MED ORDER — LOSARTAN POTASSIUM 25 MG PO TABS
25.0000 mg | ORAL_TABLET | Freq: Every day | ORAL | Status: DC
  Administered 2016-01-02: 25 mg via ORAL
  Filled 2016-01-02: qty 1

## 2016-01-02 MED ORDER — ONDANSETRON HCL 4 MG/2ML IJ SOLN
4.0000 mg | Freq: Four times a day (QID) | INTRAMUSCULAR | Status: DC | PRN
  Administered 2016-01-02: 4 mg via INTRAVENOUS
  Filled 2016-01-02: qty 2

## 2016-01-02 NOTE — Discharge Summary (Signed)
[facility] -  at [facility]   PATIENT NAME: Morgan Howard    MR#:  [id number]  DATE OF BIRTH:  1915-03-06  DATE OF ADMISSION:  01/01/2016 ADMITTING PHYSICIAN: Oralia Manisavid Willis, MD  DATE OF DISCHARGE: 01/02/16  PRIMARY CARE PHYSICIAN: Barbette ReichmannHANDE,VISHWANATH, MD    ADMISSION DIAGNOSIS:  Vomiting [R11.10] Hypoglycemia [E16.2] Intractable vomiting without nausea, vomiting of unspecified type [R11.11]  DISCHARGE DIAGNOSIS:  Hypoglycemia-resolved  SECONDARY DIAGNOSIS:   Past Medical History:  Diagnosis Date  . Diabetes (HCC)   . HLD (hyperlipidemia)   . HTN (hypertension)   . Psoriasis     HOSPITAL COURSE:  Morgan Howard  is a 82 y.o. female who presents with Progressive hypoglycemia followed by significant nausea and vomiting. Patient states that her blood sugar dropped to the 50s at home today. She had never had an episode of hypoglycemia like this before. She had significant associated symptoms which she described as some diaphoresis and "the shakes  *Hypoglycemia - blood sugars responded well to treatment by EMS and in the hospital -sugars better -Stopped Januvia and decreased Lantus to 16 units. Cont Lispor bid -caregive explained to keep log of sugars at home to discuss with PCP  *  Diabetes (HCC) - hold her insulin tonight, monitor her blood sugar was serial glucose checks - hemoglobin A1c 7.8   *Vomiting - when necessary antiemetics -resolved  *  GERD (gastroesophageal reflux disease) - home dose PPI  PT recommend HHPT  D/w caregivers plan and agreeable for d/c home CONSULTS OBTAINED:    DRUG ALLERGIES:   Allergies  Allergen Reactions  . Penicillins     Has patient had a PCN reaction causing immediate rash, facial/tongue/throat swelling, SOB or lightheadedness with hypotension: No Has patient had a PCN reaction causing severe rash involving mucus membranes or skin necrosis: No Has patient had a PCN reaction that required  hospitalization No Has patient had a PCN reaction occurring within the last 10 years: No If all of the above answers are "NO", then may proceed with Cephalosporin use.    DISCHARGE MEDICATIONS:   Current Discharge Medication List    CONTINUE these medications which have CHANGED   Details  Insulin Glargine (LANTUS SOLOSTAR) 100 UNIT/ML Solostar Pen Inject 16 Units into the skin at bedtime. Qty: 15 mL, Refills: 11      CONTINUE these medications which have NOT CHANGED   Details  acetaminophen (TYLENOL) 500 MG tablet Take 500 mg by mouth daily.    aspirin EC 81 MG tablet Take 81 mg by mouth daily.    Cyanocobalamin 1500 MCG TBDP Take 1,500 mcg by mouth daily.    docusate sodium (COLACE) 50 MG capsule Take 50 mg by mouth daily.    doxazosin (CARDURA) 2 MG tablet Take 2 mg by mouth daily.    ferrous sulfate 325 (65 FE) MG tablet Take 325 mg by mouth daily with breakfast.    insulin lispro (HUMALOG) 100 UNIT/ML KiwkPen Inject 10 Units into the skin 2 (two) times daily with a meal.    losartan (COZAAR) 25 MG tablet Take 25 mg by mouth daily.    omeprazole (PRILOSEC) 20 MG capsule Take 20 mg by mouth daily.    rOPINIRole (REQUIP) 0.5 MG tablet Take 0.5 mg by mouth 2 (two) times daily.      STOP taking these medications     sitaGLIPtin (JANUVIA) 25 MG tablet         If you experience worsening of your admission symptoms, develop  shortness of breath, life threatening emergency, suicidal or homicidal thoughts you must seek medical attention immediately by calling 911 or calling your MD immediately  if symptoms less severe.  You Must read complete instructions/literature along with all the possible adverse reactions/side effects for all the Medicines you take and that have been prescribed to you. Take any new Medicines after you have completely understood and accept all the possible adverse reactions/side effects.   Please note  You were cared for by a hospitalist during your  hospital stay. If you have any questions about your discharge medications or the care you received while you were in the hospital after you are discharged, you can call the unit and asked to speak with the hospitalist on call if the hospitalist that took care of you is not available. Once you are discharged, your primary care physician will handle any further medical issues. Please note that NO REFILLS for any discharge medications will be authorized once you are discharged, as it is imperative that you return to your primary care physician (or establish a relationship with a primary care physician if you do not have one) for your aftercare needs so that they can reassess your need for medications and monitor your lab values. Today   SUBJECTIVE   Doing well  VITAL SIGNS:  Blood pressure 138/89, pulse (!) 102, temperature 98.6 F (37 C), resp. rate 18, height  (1.473 m), weight 54.2 kg (119 lb 8 oz), SpO2 98 %.  I/O:   Intake/Output Summary (Last 24 hours) at 01/02/16 1454 Last data filed at 01/02/16 1300  Gross per 24 hour  Intake              840 ml  Output                0 ml  Net              840 ml    PHYSICAL EXAMINATION:  GENERAL:  82 y.o.-year-old patient lying in the bed with no acute distress.  EYES: Pupils equal, round, reactive to light and accommodation. No scleral icterus. Extraocular muscles intact.  HEENT: Head atraumatic, normocephalic. Oropharynx and nasopharynx clear.  NECK:  Supple, no jugular venous distention. No thyroid enlargement, no tenderness.  LUNGS: Normal breath sounds bilaterally, no wheezing, rales,rhonchi or crepitation. No use of accessory muscles of respiration.  CARDIOVASCULAR: S1, S2 normal. No murmurs, rubs, or gallops.  ABDOMEN: Soft, non-tender, non-distended. Bowel sounds present. No organomegaly or mass.  EXTREMITIES: No pedal edema, cyanosis, or clubbing.  NEUROLOGIC: Cranial nerves II through XII are intact. Muscle strength 5/5 in all  extremities. Sensation intact. Gait not checked.  PSYCHIATRIC: The patient is alert and oriented x 3.  SKIN: No obvious rash, lesion, or ulcer.   DATA REVIEW:   CBC   Recent Labs Lab 01/02/16 0522  WBC 14.5*  HGB 13.0  HCT 39.4  PLT 183    Chemistries   Recent Labs Lab 01/01/16 1821 01/02/16 0522  NA 141 139  K 4.4 4.6  CL 109 104  CO2 23 26  GLUCOSE 89 253*  BUN 21* 20  CREATININE 1.47* 1.43*  CALCIUM 9.2 9.1  AST 17  --   ALT 9*  --   ALKPHOS 76  --   BILITOT 0.4  --     Microbiology Results   No results found for this or any previous visit (from the past 240 hour(s)).  RADIOLOGY:  Ct Head Wo Contrast  Result  Date: 01/01/2016 CLINICAL DATA:  Vomiting for 1 day. The patient does not feel normal. Hypoglycemia. EXAM: CT HEAD WITHOUT CONTRAST TECHNIQUE: Contiguous axial images were obtained from the base of the skull through the vertex without intravenous contrast. COMPARISON:  Head CT scan 12/21/2012. FINDINGS: Atrophy and chronic microvascular ischemic change are seen. No evidence of acute intracranial abnormality including hemorrhage, infarct, mass lesion, mass effect, midline shift or abnormal extra-axial fluid collection. No hydrocephalus pneumocephalus. The calvarium is intact. Imaged paranasal sinuses and mastoid air cells are clear. IMPRESSION: No acute abnormality. Atrophy and chronic microvascular ischemic change. Electronically Signed   By: Drusilla Kanner M.D.   On: 01/01/2016 20:18   Dg Chest Port 1 View  Result Date: 01/01/2016 CLINICAL DATA:  Decreased blood sugar today. EXAM: PORTABLE CHEST 1 VIEW COMPARISON:  None. FINDINGS: Lungs are clear. Heart size is normal. No pneumothorax or pleural effusion. Aortic atherosclerosis is noted. IMPRESSION: No acute disease. Atherosclerosis. Electronically Signed   By: Drusilla Kanner M.D.   On: 01/01/2016 20:16   Dg Abd 2 Views  Result Date: 01/01/2016 CLINICAL DATA:  Nausea and vomiting today. EXAM: ABDOMEN -  2 VIEW COMPARISON:  None. FINDINGS: The bowel gas pattern is unremarkable. There is no evidence of bowel obstruction or pneumoperitoneum. No suspicious calcifications are present. Degenerative changes in the thoracic and lumbar spine and hips noted. IMPRESSION: No acute abnormality.  Unremarkable bowel gas pattern. Electronically Signed   By: Harmon Pier M.D.   On: 01/01/2016 21:04     Management plans discussed with the patient, family and they are in agreement.  CODE STATUS:     Code Status Orders        Start     Ordered   01/02/16 0126  Do not attempt resuscitation (DNR)  Continuous    Question Answer Comment  In the event of cardiac or respiratory ARREST Do not call a "code blue"   In the event of cardiac or respiratory ARREST Do not perform Intubation, CPR, defibrillation or ACLS   In the event of cardiac or respiratory ARREST Use medication by any route, position, wound care, and other measures to relive pain and suffering. May use oxygen, suction and manual treatment of airway obstruction as needed for comfort.      01/02/16 0125    Code Status History    Date Active Date Inactive Code Status Order ID Comments User Context   This patient has a current code status but no historical code status.    Advance Directive Documentation   Flowsheet Row Most Recent Value  Type of Advance Directive  Living will  Pre-existing out of facility DNR order (yellow form or pink MOST form)  No data  "MOST" Form in Place?  No data      TOTAL TIME TAKING CARE OF THIS PATIENT: 40 minutes.    Emonni Depasquale M.D on 01/02/2016 at 2:54 PM  Between 7am to 6pm - Pager - [phone] After 6pm go to [email or web address] - password [email or web address] [facility]  [facility]  Office  [phone]  CC: Primary care physician; Barbette Reichmann, MD

## 2016-01-02 NOTE — Care Management (Signed)
Patient admitted with hypoglycemia.  Patient lives in her own home.  Patient has private paid care givers.  Patient has care givers 7 days a week.  She is alone from the hours 8 am- 9 am.  However her caregivers are available by phone.  Patient ambulated with a walker at baseline.  [facility].  Denies any issues obtaining medications.  PT has assess patient and feel like she would benefit from home health PT.  Patient has been open with [facility] previously and would like to use them again.  [facility]  RNCM signing off

## 2016-01-02 NOTE — Evaluation (Signed)
Physical Therapy Evaluation Patient Details Name: Morgan Howard MRN: [id number] DOB: 01/05/1915 Today's Date: 01/02/2016   History of Present Illness  82 y/o female brought to ED with hypoglycemia without altered mental status after getting home reading of 53; pt kept for observation due to nausea and vomiting. Medical Hx includes HTN, DM, HLD, and psoriasis.  Clinical Impression  Morgan Howard is a pleasant 82 y/o female. She is A&O X 4 and a somewhat reliable historian with minor confusion/ forgetfulness. She is independent with bed mobility, and requires min assist with all other functional mobility. She uses RW at baseline, which she needs due to poor standing balance and general deconditioning. Strength is grossly 4/5 in UEs and LEs with MMT. She ambulates with a flexed posture and FHP and requires cues for safe technique with RW. She has a Comptrollersitter from 0900-2000 7d/wk, and has access to overnight care when necessary. At this time she would benefit from skilled PT to address deficits in strength, balance, coordination, endurance, gait, and safe use of DME. Recommend HHPT. This entire session was guided, instructed, and directly supervised by Elizabeth PalauStephanie Ray, DPT.      Follow Up Recommendations Home health PT    Equipment Recommendations  None recommended by PT    Recommendations for Other Services       Precautions / Restrictions Precautions Precautions: Fall Precaution Comments: ensure safe technique with walker Restrictions Weight Bearing Restrictions: No      Mobility  Bed Mobility Overal bed mobility: Independent             General bed mobility comments: requires increased time and effort  Transfers Overall transfer level: Needs assistance Equipment used: Rolling walker (2 wheeled) Transfers: Sit to/from Stand Sit to Stand: Min assist         General transfer comment: Pt required min assist to prevent posterior LOB due to general  deconditioning  Ambulation/Gait Ambulation/Gait assistance: Min assist Ambulation Distance (Feet): 50 Feet Assistive device: Rolling walker (2 wheeled) Gait Pattern/deviations: Step-to pattern;Shuffle;Drifts right/left;Wide base of support;Trunk flexed     General Gait Details: Pt walks with generally flexed posture and requires cuing for safe technique with RW, although RW was too tall for her, recommend pediatric RW. She depends on walker to prevent LOB; possibly related to decreased somatosensation   Stairs            Wheelchair Mobility    Modified Rankin (Stroke Patients Only)       Balance Overall balance assessment: Needs assistance Sitting-balance support: Single extremity supported Sitting balance-Leahy Scale: Fair Sitting balance - Comments: safe with single UE supported; not assessed with perturbation   Standing balance support: Bilateral upper extremity supported Standing balance-Leahy Scale: Fair Standing balance comment: Pt was initially unsteady, but gained stability within 30" required min assist and cuing for safe technique with RW. Pt had increased sway                             Pertinent Vitals/Pain Pain Assessment: No/denies pain    Home Living Family/patient expects to be discharged to:: Private residence Living Arrangements: Alone Available Help at Discharge: Personal care attendant Type of Home: House Home Access: Ramped entrance     Home Layout: Able to live on main level with bedroom/bathroom Home Equipment: Walker - 2 wheels;Wheelchair - manual Additional Comments: Pt has attendent present from 0900 to 2000 and has access to overnight care if needed  Prior Function Level of Independence: Independent with assistive device(s)         Comments: Pt uses RW for household distances     Hand Dominance        Extremity/Trunk Assessment   Upper Extremity Assessment: Generalized weakness (Grossly assessed to be 4/5)            Lower Extremity Assessment: Generalized weakness (Grossly assessed to be 4/5)      Cervical / Trunk Assessment: Kyphotic  Communication   Communication: No difficulties  Cognition Arousal/Alertness: Awake/alert Behavior During Therapy: WFL for tasks assessed/performed Overall Cognitive Status: Within Functional Limits for tasks assessed       Memory:  (Pt has minor confusion, but is overall well oriented X 4)              General Comments      Exercises        Assessment/Plan    PT Assessment Patient needs continued PT services  PT Diagnosis Difficulty walking;Abnormality of gait;Generalized weakness   PT Problem List Decreased strength;Decreased range of motion;Decreased activity tolerance;Decreased balance;Decreased mobility;Decreased coordination;Decreased cognition;Decreased knowledge of use of DME;Decreased safety awareness;Decreased knowledge of precautions  PT Treatment Interventions DME instruction;Gait training;Stair training;Functional mobility training;Therapeutic activities;Therapeutic exercise;Balance training;Neuromuscular re-education;Cognitive remediation   PT Goals (Current goals can be found in the Care Plan section) Acute Rehab PT Goals Patient Stated Goal: go home PT Goal Formulation: With patient Time For Goal Achievement: 01/16/16 Potential to Achieve Goals: Good    Frequency Min 2X/week   Barriers to discharge        Co-evaluation               End of Session Equipment Utilized During Treatment: Gait belt Activity Tolerance: Patient tolerated treatment well Patient left: in bed;with call bell/phone within reach;with bed alarm set;with family/visitor present Nurse Communication: Mobility status         Time: 1610-9604 PT Time Calculation (min) (ACUTE ONLY): 20 min   Charges:         PT G Codes:        Kenitha Glendinning Jan 05, 2016, 3:49 PM  Cassell Smiles, SPT [phone]

## 2016-01-02 NOTE — Care Management Obs Status (Signed)
MEDICARE OBSERVATION STATUS NOTIFICATION   Patient Details  Name: Morgan Howard MRN: [id number] Date of Birth: [birth year]   Medicare Observation Status Notification Given:  Yes    Chapman FitchBOWEN, Briahnna Harries T, RN 01/02/2016, 2:50 PM

## 2016-01-02 NOTE — Discharge Instructions (Signed)
Keep Log of sugars at home for PCP to review

## 2016-01-02 NOTE — Progress Notes (Signed)
Inpatient Diabetes Program Recommendations  AACE/ADA: New Consensus Statement on Inpatient Glycemic Control (2015)  Target Ranges:  Prepandial:   less than 140 mg/dL      Peak postprandial:   less than 180 mg/dL (1-2 hours)      Critically ill patients:  140 - 180 mg/dL   Lab Results  Component Value Date   GLUCAP 292 (H) 01/02/2016    Review of Glycemic Control  Results for Morgan Howard, Morgan Howard (MRN [id number]) as of 01/02/2016 08:35  Ref. Range 01/01/2016 18:07 01/01/2016 19:21 01/02/2016 03:03 01/02/2016 07:37  Glucose-Capillary Latest Ref Range: 65 - 99 mg/dL 81 409110 (H) 811199 (H) 914292 (H)     Diabetes history: Type 2, A1C in progress Outpatient Diabetes medications: Lantus 26 units qhs, Humalog 10 units bid, Januvia 25mg /day Current orders for Inpatient glycemic control: none  Inpatient Diabetes Program Recommendations:  Consider starting Lantus 11 units qhs tonight (0.2unit/kg). Consider starting Novolog sensitive correction tid with meals and 2 units Novolog mealtime insulin (hold if patient eats less than 50%).  Susette RacerJulie Jalynne Persico, RN, BA, MHA, CDE Diabetes Coordinator Inpatient Diabetes Program  [phone] (Team Pager) [phone] [facility] Office) 01/02/2016 8:39 AM

## 2016-02-04 ENCOUNTER — Emergency Department: Payer: Medicare Other

## 2016-02-04 ENCOUNTER — Inpatient Hospital Stay
Admission: EM | Admit: 2016-02-04 | Discharge: 2016-02-11 | DRG: 390 | Disposition: A | Discharge status: Home Health | Payer: Medicare Other | Attending: Internal Medicine | Admitting: Internal Medicine

## 2016-02-04 ENCOUNTER — Encounter: Payer: Self-pay | Admitting: Emergency Medicine

## 2016-02-04 DIAGNOSIS — Z803 Family history of malignant neoplasm of breast: Secondary | ICD-10-CM

## 2016-02-04 DIAGNOSIS — E785 Hyperlipidemia, unspecified: Secondary | ICD-10-CM | POA: Diagnosis present

## 2016-02-04 DIAGNOSIS — R7989 Other specified abnormal findings of blood chemistry: Secondary | ICD-10-CM

## 2016-02-04 DIAGNOSIS — T465X6A Underdosing of other antihypertensive drugs, initial encounter: Secondary | ICD-10-CM | POA: Diagnosis present

## 2016-02-04 DIAGNOSIS — K567 Ileus, unspecified: Secondary | ICD-10-CM | POA: Diagnosis not present

## 2016-02-04 DIAGNOSIS — Z823 Family history of stroke: Secondary | ICD-10-CM

## 2016-02-04 DIAGNOSIS — Z79899 Other long term (current) drug therapy: Secondary | ICD-10-CM

## 2016-02-04 DIAGNOSIS — E11649 Type 2 diabetes mellitus with hypoglycemia without coma: Secondary | ICD-10-CM | POA: Diagnosis present

## 2016-02-04 DIAGNOSIS — Z91138 Patient's unintentional underdosing of medication regimen for other reason: Secondary | ICD-10-CM

## 2016-02-04 DIAGNOSIS — I10 Essential (primary) hypertension: Secondary | ICD-10-CM | POA: Diagnosis present

## 2016-02-04 DIAGNOSIS — I4891 Unspecified atrial fibrillation: Secondary | ICD-10-CM | POA: Diagnosis present

## 2016-02-04 DIAGNOSIS — Z66 Do not resuscitate: Secondary | ICD-10-CM | POA: Diagnosis present

## 2016-02-04 DIAGNOSIS — Z8249 Family history of ischemic heart disease and other diseases of the circulatory system: Secondary | ICD-10-CM

## 2016-02-04 DIAGNOSIS — E860 Dehydration: Secondary | ICD-10-CM | POA: Diagnosis present

## 2016-02-04 DIAGNOSIS — Z880 Allergy status to penicillin: Secondary | ICD-10-CM

## 2016-02-04 DIAGNOSIS — Z888 Allergy status to other drugs, medicaments and biological substances status: Secondary | ICD-10-CM

## 2016-02-04 DIAGNOSIS — T428X6A Underdosing of antiparkinsonism drugs and other central muscle-tone depressants, initial encounter: Secondary | ICD-10-CM | POA: Diagnosis present

## 2016-02-04 DIAGNOSIS — Z794 Long term (current) use of insulin: Secondary | ICD-10-CM

## 2016-02-04 DIAGNOSIS — Y92009 Unspecified place in unspecified non-institutional (private) residence as the place of occurrence of the external cause: Secondary | ICD-10-CM

## 2016-02-04 DIAGNOSIS — K219 Gastro-esophageal reflux disease without esophagitis: Secondary | ICD-10-CM | POA: Diagnosis present

## 2016-02-04 DIAGNOSIS — R778 Other specified abnormalities of plasma proteins: Secondary | ICD-10-CM | POA: Diagnosis present

## 2016-02-04 DIAGNOSIS — I160 Hypertensive urgency: Secondary | ICD-10-CM | POA: Diagnosis present

## 2016-02-04 DIAGNOSIS — Z7982 Long term (current) use of aspirin: Secondary | ICD-10-CM

## 2016-02-04 DIAGNOSIS — E118 Type 2 diabetes mellitus with unspecified complications: Secondary | ICD-10-CM

## 2016-02-04 DIAGNOSIS — R109 Unspecified abdominal pain: Secondary | ICD-10-CM

## 2016-02-04 DIAGNOSIS — R112 Nausea with vomiting, unspecified: Secondary | ICD-10-CM

## 2016-02-04 DIAGNOSIS — R627 Adult failure to thrive: Secondary | ICD-10-CM | POA: Diagnosis present

## 2016-02-04 DIAGNOSIS — G2581 Restless legs syndrome: Secondary | ICD-10-CM | POA: Diagnosis present

## 2016-02-04 LAB — COMPREHENSIVE METABOLIC PANEL
ALT: 28 U/L (ref 14–54)
AST: 19 U/L (ref 15–41)
Albumin: 3.9 g/dL (ref 3.5–5.0)
Alkaline Phosphatase: 85 U/L (ref 38–126)
Anion gap: 11 (ref 5–15)
BUN: 31 mg/dL — ABNORMAL HIGH (ref 6–20)
CHLORIDE: 98 mmol/L — AB (ref 101–111)
CO2: 28 mmol/L (ref 22–32)
Calcium: 8.9 mg/dL (ref 8.9–10.3)
Creatinine, Ser: 1.59 mg/dL — ABNORMAL HIGH (ref 0.44–1.00)
GFR, EST AFRICAN AMERICAN: 30 mL/min — AB (ref >60)
GFR, EST NON AFRICAN AMERICAN: 26 mL/min — AB (ref >60)
Glucose, Bld: 105 mg/dL — ABNORMAL HIGH (ref 65–99)
POTASSIUM: 3.9 mmol/L (ref 3.5–5.1)
Sodium: 137 mmol/L (ref 135–145)
Total Bilirubin: 0.7 mg/dL (ref 0.3–1.2)
Total Protein: 7.2 g/dL (ref 6.5–8.1)

## 2016-02-04 LAB — CBC
HEMATOCRIT: 38.2 % (ref 35.0–47.0)
Hemoglobin: 13 g/dL (ref 12.0–16.0)
MCH: 28.9 pg (ref 26.0–34.0)
MCHC: 34 g/dL (ref 32.0–36.0)
MCV: 84.9 fL (ref 80.0–100.0)
Platelets: 174 10*3/uL (ref 150–440)
RBC: 4.49 MIL/uL (ref 3.80–5.20)
RDW: 14.3 % (ref 11.5–14.5)
WBC: 8.3 10*3/uL (ref 3.6–11.0)

## 2016-02-04 LAB — TROPONIN I: Troponin I: <0.03 ng/mL (ref <0.03)

## 2016-02-04 LAB — LIPASE, BLOOD: LIPASE: 43 U/L (ref 11–51)

## 2016-02-04 MED ORDER — SODIUM CHLORIDE 0.9 % IV SOLN
Freq: Once | INTRAVENOUS | Status: AC
  Administered 2016-02-04: 23:00:00 via INTRAVENOUS

## 2016-02-04 MED ORDER — ONDANSETRON HCL 4 MG/2ML IJ SOLN: 8.0000 mg | Freq: Once | INTRAMUSCULAR | Status: DC

## 2016-02-04 MED ORDER — ONDANSETRON HCL 4 MG/2ML IJ SOLN
INTRAMUSCULAR | Status: AC
  Administered 2016-02-04: 8 mg
  Filled 2016-02-04: qty 4

## 2016-02-04 MED ORDER — PROMETHAZINE HCL 25 MG/ML IJ SOLN
12.5000 mg | Freq: Once | INTRAMUSCULAR | Status: AC
  Administered 2016-02-04: 12.5 mg via INTRAVENOUS

## 2016-02-04 MED ORDER — PROMETHAZINE HCL 25 MG/ML IJ SOLN
INTRAMUSCULAR | Status: AC
  Administered 2016-02-04: 12.5 mg via INTRAVENOUS
  Filled 2016-02-04: qty 1

## 2016-02-04 MED ORDER — IOPAMIDOL (ISOVUE-300) INJECTION 61%
30.0000 mL | Freq: Once | INTRAVENOUS | Status: AC
  Administered 2016-02-04: 30 mL via ORAL

## 2016-02-04 NOTE — ED Triage Notes (Signed)
Pt comes into the ED via EMS from home c/o failure to thrive due to being Nauseas for 1 week.  Patient is refusing to eat and take medications at home.  Patient hospitalized 1 month ago for stomach issue similarly.  Patient in NAD at this time with stable VS and A&Ox4.

## 2016-02-04 NOTE — ED Provider Notes (Signed)
[facility] Emergency Department Provider Note   ____________________________________________   First MD Initiated Contact with Patient 02/04/16 2101     (approximate)  I have reviewed the triage vital signs and the nursing notes.   HISTORY  Chief Complaint Failure To Thrive and Nausea    HPI Morgan Howard is a 82 y.o. female a she reports she has been having nausea and vomiting for the last almost week. She's been unable to keep anything down. She's not been taking her medicines. She has lost at least 2 pounds. She reports sometimes her belly gets bloated but it is not today. She says she has a fever. The fever is an elevation of her baseline temperature 90.7 to 98. She is not coughing or having any other complaints. Nothing seems to make this better or worse. She had something like this about a month ago but it went away with medicine. Patient had Zofran in the ambulance and felt better for a little bit but is now vomiting again.  Past Medical History:  Diagnosis Date  . Diabetes (HCC)   . HLD (hyperlipidemia)   . HTN (hypertension)   . Psoriasis     Patient Active Problem List   Diagnosis Date Noted  . Ileus (HCC) Mar 08, 2016  . RLS (restless legs syndrome) 08-Mar-2016  . A-fib (HCC) 03-08-16  . CKD (chronic kidney disease) 03/08/2016  . Dehydration 02/05/2016  . Diabetes (HCC) 01/01/2016  . Hypoglycemia 01/01/2016  . Nausea & vomiting 01/01/2016  . GERD (gastroesophageal reflux disease) 01/01/2016    Past Surgical History:  Procedure Laterality Date  . ABDOMINAL HYSTERECTOMY    . APPENDECTOMY    . CHOLECYSTECTOMY      Prior to Admission medications   Medication Sig Start Date End Date Taking? Authorizing Provider  acetaminophen (TYLENOL) 500 MG tablet Take 500 mg by mouth daily.   Yes Historical Provider, MD  aspirin EC 81 MG tablet Take 81 mg by mouth daily.   Yes Historical Provider, MD  docusate sodium (COLACE) 50 MG capsule  Take 50 mg by mouth daily.   Yes Historical Provider, MD  doxazosin (CARDURA) 2 MG tablet Take 2 mg by mouth daily.   Yes Historical Provider, MD  ferrous sulfate 325 (65 FE) MG tablet Take 325 mg by mouth daily with breakfast.   Yes Historical Provider, MD  Insulin Glargine (LANTUS SOLOSTAR) 100 UNIT/ML Solostar Pen Inject 16 Units into the skin at bedtime. Patient taking differently: Inject 26 Units into the skin at bedtime.  01/02/16  Yes Enedina Finner, MD  losartan (COZAAR) 25 MG tablet Take 25 mg by mouth daily.   Yes Historical Provider, MD  omeprazole (PRILOSEC) 20 MG capsule Take 20 mg by mouth daily.   Yes Historical Provider, MD  rOPINIRole (REQUIP) 0.5 MG tablet Take 0.5 mg by mouth 2 (two) times daily.   Yes Historical Provider, MD  sitaGLIPtin (JANUVIA) 25 MG tablet Take 25 mg by mouth daily.   Yes Historical Provider, MD  vitamin B-12 (CYANOCOBALAMIN) 1000 MCG tablet Take 1,000 mcg by mouth daily.   Yes Historical Provider, MD  zolpidem (AMBIEN) 5 MG tablet Take 0.5 tablets (2.5 mg total) by mouth at bedtime as needed for sleep. 02/11/16   Enedina Finner, MD    Allergies Biaxin [clarithromycin]; Lipitor [atorvastatin]; Lovastatin; Nabumetone; Penicillins; Psoralea; and Zantac [ranitidine hcl]  Family History  Problem Relation Age of Onset  . Stroke Mother   . Heart attack Father   . Breast cancer  Sister     Social History Social History  Substance Use Topics  . Smoking status: Never Smoker  . Smokeless tobacco: Never Used  . Alcohol use No    Review of Systems Constitutional: No fever/chills Eyes: No visual changes. ENT: No sore throat. Cardiovascular: Denies chest pain. Respiratory: Denies shortness of breath. Gastrointestinal: See history of present illness Genitourinary: Negative for dysuria. Musculoskeletal: Negative for back pain. Skin: Negative for rash.  10-point ROS otherwise negative.  ____________________________________________   PHYSICAL EXAM:  VITAL  SIGNS: ED Triage Vitals  Enc Vitals Group     BP 02/04/16 2037 133/71     Pulse Rate 02/04/16 2037 88     Resp 02/04/16 2037 18     Temp 02/04/16 2037 98.7 F (37.1 C)     Temp Source 02/04/16 2037 Oral     SpO2 02/04/16 2037 98 %     Weight 02/04/16 2039 122 lb 1.6 oz (55.4 kg)     Height 02/04/16 2039 4\' 11"  (1.499 m)     Head Circumference --      Peak Flow --      Pain Score --      Pain Loc --      Pain Edu? --      Excl. in GC? --     Constitutional: Alert and oriented. Well appearing and in no acute distress. Eyes: Conjunctivae are normal. PERRL. EOMI. Head: Atraumatic. Nose: No congestion/rhinnorhea. Mouth/Throat: Mucous membranes are moist.  Oropharynx non-erythematous. Neck: No stridor.   Cardiovascular: Normal rate, regular rhythm. Grossly normal heart sounds.  Good peripheral circulation. Respiratory: Normal respiratory effort.  No retractions. Lungs CTAB. Gastrointestinal: Soft and nontender. No distention. No abdominal bruits. No CVA tenderness. Rectal: No masses or fecal impaction.:  Musculoskeletal: No lower extremity tenderness nor edema.  No joint effusions. Neurologic:  Normal speech and language. No gross focal neurologic deficits are appreciated. No gait instability. Skin:  Skin is warm, dry and intact. No rash noted. Psychiatric: Mood and affect are normal. Speech and behavior are normal.  ____________________________________________   LABS (all labs ordered are listed, but only abnormal results are displayed)  Labs Reviewed  COMPREHENSIVE METABOLIC PANEL - Abnormal; Notable for the following:       Result Value   Chloride 98 (*)    Glucose, Bld 105 (*)    BUN 31 (*)    Creatinine, Ser 1.59 (*)    GFR calc non Af Amer 26 (*)    GFR calc Af Amer 30 (*)    All other components within normal limits  BASIC METABOLIC PANEL - Abnormal; Notable for the following:    BUN 28 (*)    Creatinine, Ser 1.59 (*)    Calcium 8.5 (*)    GFR calc non Af Amer  26 (*)    GFR calc Af Amer 30 (*)    All other components within normal limits  URINALYSIS COMPLETEWITH MICROSCOPIC ([facility] ONLY) - Abnormal; Notable for the following:    Color, Urine YELLOW (*)    APPearance CLEAR (*)    Glucose, UA 50 (*)    Ketones, ur 1+ (*)    Hgb urine dipstick 1+ (*)    Protein, ur 100 (*)    All other components within normal limits  MAGNESIUM - Abnormal; Notable for the following:    Magnesium 2.6 (*)    All other components within normal limits  GLUCOSE, CAPILLARY - Abnormal; Notable for the following:    Glucose-Capillary 56 (*)  All other components within normal limits  GLUCOSE, CAPILLARY - Abnormal; Notable for the following:    Glucose-Capillary 251 (*)    All other components within normal limits  GLUCOSE, CAPILLARY - Abnormal; Notable for the following:    Glucose-Capillary 121 (*)    All other components within normal limits  BASIC METABOLIC PANEL - Abnormal; Notable for the following:    BUN 22 (*)    Creatinine, Ser 1.51 (*)    Calcium 8.2 (*)    GFR calc non Af Amer 27 (*)    GFR calc Af Amer 32 (*)    All other components within normal limits  GLUCOSE, CAPILLARY - Abnormal; Notable for the following:    Glucose-Capillary 112 (*)    All other components within normal limits  GLUCOSE, CAPILLARY - Abnormal; Notable for the following:    Glucose-Capillary 105 (*)    All other components within normal limits  GLUCOSE, CAPILLARY - Abnormal; Notable for the following:    Glucose-Capillary 123 (*)    All other components within normal limits  GLUCOSE, CAPILLARY - Abnormal; Notable for the following:    Glucose-Capillary 146 (*)    All other components within normal limits  CBC - Abnormal; Notable for the following:    Hemoglobin 11.6 (*)    HCT 34.2 (*)    RDW 14.6 (*)    All other components within normal limits  BASIC METABOLIC PANEL - Abnormal; Notable for the following:    Potassium 3.4 (*)    Glucose, Bld 122 (*)    BUN 31 (*)      Creatinine, Ser 1.79 (*)    Calcium 8.0 (*)    GFR calc non Af Amer 22 (*)    GFR calc Af Amer 26 (*)    All other components within normal limits  GLUCOSE, CAPILLARY - Abnormal; Notable for the following:    Glucose-Capillary 111 (*)    All other components within normal limits  GLUCOSE, CAPILLARY - Abnormal; Notable for the following:    Glucose-Capillary 128 (*)    All other components within normal limits  GLUCOSE, CAPILLARY - Abnormal; Notable for the following:    Glucose-Capillary 156 (*)    All other components within normal limits  GLUCOSE, CAPILLARY - Abnormal; Notable for the following:    Glucose-Capillary 183 (*)    All other components within normal limits  GLUCOSE, CAPILLARY - Abnormal; Notable for the following:    Glucose-Capillary 177 (*)    All other components within normal limits  GLUCOSE, CAPILLARY - Abnormal; Notable for the following:    Glucose-Capillary 194 (*)    All other components within normal limits  GLUCOSE, CAPILLARY - Abnormal; Notable for the following:    Glucose-Capillary 175 (*)    All other components within normal limits  GLUCOSE, CAPILLARY - Abnormal; Notable for the following:    Glucose-Capillary 131 (*)    All other components within normal limits  GLUCOSE, CAPILLARY - Abnormal; Notable for the following:    Glucose-Capillary 100 (*)    All other components within normal limits  GLUCOSE, CAPILLARY - Abnormal; Notable for the following:    Glucose-Capillary 185 (*)    All other components within normal limits  GLUCOSE, CAPILLARY - Abnormal; Notable for the following:    Glucose-Capillary 157 (*)    All other components within normal limits  BASIC METABOLIC PANEL - Abnormal; Notable for the following:    Potassium 2.8 (*)    Chloride 113 (*)  CO2 21 (*)    Glucose, Bld 120 (*)    BUN 25 (*)    Creatinine, Ser 1.40 (*)    Calcium 8.0 (*)    GFR calc non Af Amer 30 (*)    GFR calc Af Amer 35 (*)    All other components  within normal limits  GLUCOSE, CAPILLARY - Abnormal; Notable for the following:    Glucose-Capillary 103 (*)    All other components within normal limits  GLUCOSE, CAPILLARY - Abnormal; Notable for the following:    Glucose-Capillary 130 (*)    All other components within normal limits  GLUCOSE, CAPILLARY - Abnormal; Notable for the following:    Glucose-Capillary 211 (*)    All other components within normal limits  GLUCOSE, CAPILLARY - Abnormal; Notable for the following:    Glucose-Capillary 152 (*)    All other components within normal limits  GLUCOSE, CAPILLARY - Abnormal; Notable for the following:    Glucose-Capillary 162 (*)    All other components within normal limits  CBC - Abnormal; Notable for the following:    RDW 14.7 (*)    All other components within normal limits  GLUCOSE, CAPILLARY - Abnormal; Notable for the following:    Glucose-Capillary 169 (*)    All other components within normal limits  GLUCOSE, CAPILLARY - Abnormal; Notable for the following:    Glucose-Capillary 231 (*)    All other components within normal limits  GLUCOSE, CAPILLARY - Abnormal; Notable for the following:    Glucose-Capillary 240 (*)    All other components within normal limits  LIPASE, BLOOD  CBC  TROPONIN I  CBC  GLUCOSE, CAPILLARY  PHOSPHORUS  GLUCOSE, CAPILLARY  POTASSIUM   ____________________________________________  EKG  EKG read and interpreted by me shows an irregularly irregular rhythm with no P waves consistent with atrial fibrillation. There are no acute ST-T wave changes that I can see. This rhythm was present on EKG last month. ____________________________________________  RADIOLOGY   ____________________________________________   PROCEDURES  Procedure(s) performed:   Procedures  Critical Care performed:   ____________________________________________   INITIAL IMPRESSION / ASSESSMENT AND PLAN / ED COURSE  Pertinent labs & imaging results that were  available during my care of the patient were reviewed by me and considered in my medical decision making (see chart for details).    Clinical Course     ____________________________________________   FINAL CLINICAL IMPRESSION(S) / ED DIAGNOSES  Final diagnoses:  Elevated troponin      NEW MEDICATIONS STARTED DURING THIS VISIT:  Discharge Medication List as of 02/11/2016  1:06 PM    START taking these medications   Details  zolpidem (AMBIEN) 5 MG tablet Take 0.5 tablets (2.5 mg total) by mouth at bedtime as needed for sleep., Starting Wed 02/11/2016, Print         Note:  This document was prepared using Dragon voice recognition software and may include unintentional dictation errors.    Arnaldo NatalPaul F Jalyiah Shelley, MD 02/13/16 [id number]

## 2016-02-04 NOTE — ED Notes (Signed)
Pt transferred to CT via stretcher.  

## 2016-02-05 DIAGNOSIS — Z880 Allergy status to penicillin: Secondary | ICD-10-CM | POA: Diagnosis not present

## 2016-02-05 DIAGNOSIS — Y92009 Unspecified place in unspecified non-institutional (private) residence as the place of occurrence of the external cause: Secondary | ICD-10-CM | POA: Diagnosis not present

## 2016-02-05 DIAGNOSIS — G2581 Restless legs syndrome: Secondary | ICD-10-CM | POA: Diagnosis present

## 2016-02-05 DIAGNOSIS — E860 Dehydration: Secondary | ICD-10-CM | POA: Diagnosis present

## 2016-02-05 DIAGNOSIS — R627 Adult failure to thrive: Secondary | ICD-10-CM | POA: Diagnosis present

## 2016-02-05 DIAGNOSIS — R112 Nausea with vomiting, unspecified: Secondary | ICD-10-CM | POA: Diagnosis present

## 2016-02-05 DIAGNOSIS — K219 Gastro-esophageal reflux disease without esophagitis: Secondary | ICD-10-CM | POA: Diagnosis present

## 2016-02-05 DIAGNOSIS — I160 Hypertensive urgency: Secondary | ICD-10-CM | POA: Diagnosis present

## 2016-02-05 DIAGNOSIS — Z66 Do not resuscitate: Secondary | ICD-10-CM | POA: Diagnosis present

## 2016-02-05 DIAGNOSIS — Z794 Long term (current) use of insulin: Secondary | ICD-10-CM | POA: Diagnosis not present

## 2016-02-05 DIAGNOSIS — Z823 Family history of stroke: Secondary | ICD-10-CM | POA: Diagnosis not present

## 2016-02-05 DIAGNOSIS — T428X6A Underdosing of antiparkinsonism drugs and other central muscle-tone depressants, initial encounter: Secondary | ICD-10-CM | POA: Diagnosis present

## 2016-02-05 DIAGNOSIS — R1110 Vomiting, unspecified: Secondary | ICD-10-CM | POA: Diagnosis not present

## 2016-02-05 DIAGNOSIS — I10 Essential (primary) hypertension: Secondary | ICD-10-CM | POA: Diagnosis present

## 2016-02-05 DIAGNOSIS — K567 Ileus, unspecified: Secondary | ICD-10-CM | POA: Diagnosis present

## 2016-02-05 DIAGNOSIS — Z91138 Patient's unintentional underdosing of medication regimen for other reason: Secondary | ICD-10-CM | POA: Diagnosis not present

## 2016-02-05 DIAGNOSIS — Z7982 Long term (current) use of aspirin: Secondary | ICD-10-CM | POA: Diagnosis not present

## 2016-02-05 DIAGNOSIS — Z888 Allergy status to other drugs, medicaments and biological substances status: Secondary | ICD-10-CM | POA: Diagnosis not present

## 2016-02-05 DIAGNOSIS — E11649 Type 2 diabetes mellitus with hypoglycemia without coma: Secondary | ICD-10-CM | POA: Diagnosis present

## 2016-02-05 DIAGNOSIS — E785 Hyperlipidemia, unspecified: Secondary | ICD-10-CM | POA: Diagnosis present

## 2016-02-05 DIAGNOSIS — Z803 Family history of malignant neoplasm of breast: Secondary | ICD-10-CM | POA: Diagnosis not present

## 2016-02-05 DIAGNOSIS — I4891 Unspecified atrial fibrillation: Secondary | ICD-10-CM | POA: Diagnosis present

## 2016-02-05 DIAGNOSIS — T465X6A Underdosing of other antihypertensive drugs, initial encounter: Secondary | ICD-10-CM | POA: Diagnosis present

## 2016-02-05 DIAGNOSIS — R778 Other specified abnormalities of plasma proteins: Secondary | ICD-10-CM | POA: Diagnosis present

## 2016-02-05 DIAGNOSIS — Z79899 Other long term (current) drug therapy: Secondary | ICD-10-CM | POA: Diagnosis not present

## 2016-02-05 DIAGNOSIS — Z8249 Family history of ischemic heart disease and other diseases of the circulatory system: Secondary | ICD-10-CM | POA: Diagnosis not present

## 2016-02-05 LAB — CBC
HCT: 38.4 % (ref 35.0–47.0)
Hemoglobin: 12.9 g/dL (ref 12.0–16.0)
MCH: 28.8 pg (ref 26.0–34.0)
MCHC: 33.6 g/dL (ref 32.0–36.0)
MCV: 85.7 fL (ref 80.0–100.0)
PLATELETS: 171 10*3/uL (ref 150–440)
RBC: 4.48 MIL/uL (ref 3.80–5.20)
RDW: 14.2 % (ref 11.5–14.5)
WBC: 11 10*3/uL (ref 3.6–11.0)

## 2016-02-05 LAB — URINALYSIS COMPLETE WITH MICROSCOPIC (ARMC ONLY)
BACTERIA UA: NONE SEEN
Bilirubin Urine: NEGATIVE
GLUCOSE, UA: 50 mg/dL — AB
LEUKOCYTES UA: NEGATIVE
Nitrite: NEGATIVE
PROTEIN: 100 mg/dL — AB
SPECIFIC GRAVITY, URINE: 1.015 (ref 1.005–1.030)
SQUAMOUS EPITHELIAL / LPF: NONE SEEN
pH: 5 (ref 5.0–8.0)

## 2016-02-05 LAB — BASIC METABOLIC PANEL
Anion gap: 9 (ref 5–15)
BUN: 28 mg/dL — AB (ref 6–20)
CHLORIDE: 102 mmol/L (ref 101–111)
CO2: 27 mmol/L (ref 22–32)
CREATININE: 1.59 mg/dL — AB (ref 0.44–1.00)
Calcium: 8.5 mg/dL — ABNORMAL LOW (ref 8.9–10.3)
GFR calc Af Amer: 30 mL/min — ABNORMAL LOW (ref >60)
GFR, EST NON AFRICAN AMERICAN: 26 mL/min — AB (ref >60)
GLUCOSE: 92 mg/dL (ref 65–99)
Potassium: 3.9 mmol/L (ref 3.5–5.1)
SODIUM: 138 mmol/L (ref 135–145)

## 2016-02-05 LAB — GLUCOSE, CAPILLARY
GLUCOSE-CAPILLARY: 95 mg/dL (ref 65–99)
GLUCOSE-CAPILLARY: 56 mg/dL — AB (ref 65–99)
GLUCOSE-CAPILLARY: 105 mg/dL — AB (ref 65–99)
Glucose-Capillary: 251 mg/dL — ABNORMAL HIGH (ref 65–99)
Glucose-Capillary: 121 mg/dL — ABNORMAL HIGH (ref 65–99)
Glucose-Capillary: 112 mg/dL — ABNORMAL HIGH (ref 65–99)

## 2016-02-05 LAB — PHOSPHORUS: PHOSPHORUS: 3.1 mg/dL (ref 2.5–4.6)

## 2016-02-05 LAB — MAGNESIUM: MAGNESIUM: 2.6 mg/dL — AB (ref 1.7–2.4)

## 2016-02-05 MED ORDER — POTASSIUM CHLORIDE CRYS ER 10 MEQ PO TBCR
10.0000 meq | EXTENDED_RELEASE_TABLET | Freq: Every day | ORAL | Status: DC
  Administered 2016-02-11: 10 meq via ORAL
  Filled 2016-02-05: qty 1

## 2016-02-05 MED ORDER — ACETAMINOPHEN 325 MG PO TABS
650.0000 mg | ORAL_TABLET | Freq: Four times a day (QID) | ORAL | Status: DC | PRN
Start: 2016-02-05 — End: 2016-02-11

## 2016-02-05 MED ORDER — METOPROLOL TARTRATE 5 MG/5ML IV SOLN
2.5000 mg | Freq: Once | INTRAVENOUS | Status: AC
  Administered 2016-02-05: 2.5 mg via INTRAVENOUS
  Filled 2016-02-05 (×2): qty 5

## 2016-02-05 MED ORDER — DOCUSATE SODIUM 100 MG PO CAPS: 100.0000 mg | ORAL_CAPSULE | Freq: Every day | ORAL | Status: DC

## 2016-02-05 MED ORDER — SENNOSIDES-DOCUSATE SODIUM 8.6-50 MG PO TABS: 1.0000 | ORAL_TABLET | Freq: Every evening | ORAL | Status: DC | PRN

## 2016-02-05 MED ORDER — LOSARTAN POTASSIUM 50 MG PO TABS
25.0000 mg | ORAL_TABLET | Freq: Every day | ORAL | Status: DC
  Administered 2016-02-05: 25 mg via ORAL
  Filled 2016-02-05: qty 1

## 2016-02-05 MED ORDER — ACETAMINOPHEN 650 MG RE SUPP
650.0000 mg | Freq: Four times a day (QID) | RECTAL | Status: DC | PRN
Start: 2016-02-05 — End: 2016-02-11
  Administered 2016-02-09: 650 mg via RECTAL
  Filled 2016-02-05: qty 1

## 2016-02-05 MED ORDER — ONDANSETRON HCL 4 MG/2ML IJ SOLN
4.0000 mg | Freq: Four times a day (QID) | INTRAMUSCULAR | Status: DC | PRN
  Administered 2016-02-05 – 2016-02-06 (×3): 4 mg via INTRAVENOUS
  Filled 2016-02-05 (×2): qty 2

## 2016-02-05 MED ORDER — ONDANSETRON HCL 4 MG PO TABS: 4.0000 mg | ORAL_TABLET | Freq: Four times a day (QID) | ORAL | Status: DC | PRN

## 2016-02-05 MED ORDER — PANTOPRAZOLE SODIUM 40 MG PO TBEC: 40.0000 mg | DELAYED_RELEASE_TABLET | Freq: Every day | ORAL | Status: DC

## 2016-02-05 MED ORDER — BISACODYL 5 MG PO TBEC: 5.0000 mg | DELAYED_RELEASE_TABLET | Freq: Every day | ORAL | Status: DC | PRN

## 2016-02-05 MED ORDER — MAGNESIUM CITRATE PO SOLN: 1.0000 | Freq: Once | ORAL | Status: DC | PRN

## 2016-02-05 MED ORDER — DOXAZOSIN MESYLATE 4 MG PO TABS
2.0000 mg | ORAL_TABLET | Freq: Every day | ORAL | Status: DC
  Administered 2016-02-05: 2 mg via ORAL
  Filled 2016-02-05: qty 1

## 2016-02-05 MED ORDER — INSULIN ASPART 100 UNIT/ML ~~LOC~~ SOLN
0.0000 [IU] | Freq: Three times a day (TID) | SUBCUTANEOUS | Status: DC
  Administered 2016-02-06: 1 [IU] via SUBCUTANEOUS
  Administered 2016-02-08: 2 [IU] via SUBCUTANEOUS
  Administered 2016-02-08: 1 [IU] via SUBCUTANEOUS
  Administered 2016-02-08 – 2016-02-09 (×3): 2 [IU] via SUBCUTANEOUS
  Administered 2016-02-10 (×2): 3 [IU] via SUBCUTANEOUS
  Administered 2016-02-10: 2 [IU] via SUBCUTANEOUS
  Administered 2016-02-11 (×2): 3 [IU] via SUBCUTANEOUS
  Filled 2016-02-05: qty 1
  Filled 2016-02-05: qty 3
  Filled 2016-02-05: qty 2
  Filled 2016-02-05: qty 3
  Filled 2016-02-05: qty 2
  Filled 2016-02-05: qty 3
  Filled 2016-02-05: qty 1
  Filled 2016-02-05 (×4): qty 2

## 2016-02-05 MED ORDER — INSULIN ASPART 100 UNIT/ML ~~LOC~~ SOLN: 0.0000 [IU] | Freq: Every day | SUBCUTANEOUS | Status: DC

## 2016-02-05 MED ORDER — HEPARIN SODIUM (PORCINE) 5000 UNIT/ML IJ SOLN
5000.0000 [IU] | Freq: Three times a day (TID) | INTRAMUSCULAR | Status: DC
  Administered 2016-02-05 – 2016-02-11 (×19): 5000 [IU] via SUBCUTANEOUS
  Filled 2016-02-05 (×19): qty 1

## 2016-02-05 MED ORDER — DEXTROSE 50 % IV SOLN
1.0000 | Freq: Once | INTRAVENOUS | Status: AC
  Administered 2016-02-05: 50 mL via INTRAVENOUS
  Filled 2016-02-05: qty 50

## 2016-02-05 MED ORDER — LINAGLIPTIN 5 MG PO TABS: 5.0000 mg | ORAL_TABLET | Freq: Every day | ORAL | Status: DC

## 2016-02-05 MED ORDER — ASPIRIN EC 81 MG PO TBEC
81.0000 mg | DELAYED_RELEASE_TABLET | Freq: Every day | ORAL | Status: DC
Start: 2016-02-05 — End: 2016-02-08

## 2016-02-05 MED ORDER — LABETALOL HCL 5 MG/ML IV SOLN
10.0000 mg | INTRAVENOUS | Status: DC | PRN
  Administered 2016-02-06: 10 mg via INTRAVENOUS
  Filled 2016-02-05 (×2): qty 4

## 2016-02-05 MED ORDER — VITAMIN B-12 1000 MCG PO TABS: 1000.0000 ug | ORAL_TABLET | Freq: Every day | ORAL | Status: DC

## 2016-02-05 MED ORDER — ENOXAPARIN SODIUM 40 MG/0.4ML ~~LOC~~ SOLN: 40.0000 mg | SUBCUTANEOUS | Status: DC

## 2016-02-05 MED ORDER — INSULIN GLARGINE 100 UNIT/ML ~~LOC~~ SOLN
13.0000 [IU] | Freq: Every day | SUBCUTANEOUS | Status: DC
  Administered 2016-02-05: 13 [IU] via SUBCUTANEOUS
  Filled 2016-02-05 (×2): qty 0.13

## 2016-02-05 MED ORDER — PROMETHAZINE HCL 25 MG/ML IJ SOLN
12.5000 mg | Freq: Four times a day (QID) | INTRAMUSCULAR | Status: DC | PRN
  Administered 2016-02-05 – 2016-02-06 (×3): 12.5 mg via INTRAVENOUS
  Filled 2016-02-05 (×3): qty 1

## 2016-02-05 MED ORDER — ROPINIROLE HCL 0.25 MG PO TABS
0.5000 mg | ORAL_TABLET | Freq: Two times a day (BID) | ORAL | Status: DC
  Administered 2016-02-05 (×2): 0.5 mg via ORAL
  Filled 2016-02-05 (×4): qty 2

## 2016-02-05 MED ORDER — SODIUM CHLORIDE 0.9 % IV SOLN
INTRAVENOUS | Status: DC
  Administered 2016-02-05: 75 mL/h via INTRAVENOUS
  Administered 2016-02-05: 125 mL/h via INTRAVENOUS
  Administered 2016-02-05 – 2016-02-09 (×8): via INTRAVENOUS

## 2016-02-05 MED ORDER — FERROUS SULFATE 325 (65 FE) MG PO TABS: 325.0000 mg | ORAL_TABLET | Freq: Every day | ORAL | Status: DC

## 2016-02-05 MED ORDER — DOCUSATE SODIUM 50 MG PO CAPS
50.0000 mg | ORAL_CAPSULE | Freq: Every day | ORAL | Status: DC
  Filled 2016-02-05: qty 1

## 2016-02-05 NOTE — Care Management (Signed)
Met with patient and care giver at bedside. Patient has 24 hour caregiver support. She has no needs identified by care giver or RNCM. PCP is Dr. Ginette Pitman. Case Closed.

## 2016-02-05 NOTE — H&P (Signed)
[facility] - [place] [facility] Admission History and Physical Tonye RoyaltyAlexis Alece Koppel, D.O.  ---------------------------------------------------------------------------------------------------------------------   PATIENT NAME: Morgan Howard MR#: [id number] DATE OF BIRTH: 1914/07/19 DATE OF ADMISSION: 02/04/2016 PRIMARY CARE PHYSICIAN: Barbette ReichmannHANDE,VISHWANATH, MD  REQUESTING/REFERRING PHYSICIAN: ED Dr. Darnelle CatalanMalinda  CHIEF COMPLAINT: Chief Complaint  Patient presents with  . Failure To Thrive  . Nausea    HISTORY OF PRESENT ILLNESS: Morgan Cockerdith Colburn is a 82 y.o. female with a known history of Hypertension, hyperlipidemia and diabetes was in a usual state of health until 4 days ago when she began experiencing nausea and vomiting. She reports that she has yellow emesis and a lot of mucus. She has been unable to hold down any liquids, solids or medication. She denies any abdominal pain, cramping, diarrhea. She states that her last bowel movement was normal. Her urine is normal. She denies fevers, chills, cough, dysuria.  At present patient is complaining of severe restless legs described as deep cramping, discomfort. She has not been able to take her medications including her Requip.  Otherwise there has been no change in status. Patient has been taking medication as prescribed and there has been no recent change in medication or diet.  There has been no recent illness, travel or sick contacts.    Patient denies fevers/chills, weakness, dizziness, chest pain, shortness of breath, C/D, abdominal pain, dysuria/frequency, changes in mental status.   EMS/ED COURSE:    Patient received IVNS, Zofran and Phenergan.   PAST MEDICAL HISTORY: Past Medical History:  Diagnosis Date  . Diabetes (HCC)   . HLD (hyperlipidemia)   . HTN (hypertension)   . Psoriasis       PAST SURGICAL HISTORY: Past Surgical History:  Procedure Laterality Date  . ABDOMINAL HYSTERECTOMY    . APPENDECTOMY    .  CHOLECYSTECTOMY        SOCIAL HISTORY: Social History  Substance Use Topics  . Smoking status: Never Smoker  . Smokeless tobacco: Never Used  . Alcohol use No      FAMILY HISTORY: Family History  Problem Relation Age of Onset  . Stroke Mother   . Heart attack Father   . Breast cancer Sister      MEDICATIONS AT HOME: Prior to Admission medications   Medication Sig Start Date End Date Taking? Authorizing Provider  acetaminophen (TYLENOL) 500 MG tablet Take 500 mg by mouth daily.   Yes Historical Provider, MD  aspirin EC 81 MG tablet Take 81 mg by mouth daily.   Yes Historical Provider, MD  docusate sodium (COLACE) 50 MG capsule Take 50 mg by mouth daily.   Yes Historical Provider, MD  doxazosin (CARDURA) 2 MG tablet Take 2 mg by mouth daily.   Yes Historical Provider, MD  ferrous sulfate 325 (65 FE) MG tablet Take 325 mg by mouth daily with breakfast.   Yes Historical Provider, MD  Insulin Glargine (LANTUS SOLOSTAR) 100 UNIT/ML Solostar Pen Inject 16 Units into the skin at bedtime. Patient taking differently: Inject 26 Units into the skin at bedtime.  01/02/16  Yes Enedina FinnerSona Patel, MD  insulin lispro (HUMALOG) 100 UNIT/ML KiwkPen Inject 10 Units into the skin 2 (two) times daily with a meal.   Yes Historical Provider, MD  losartan (COZAAR) 25 MG tablet Take 25 mg by mouth daily.   Yes Historical Provider, MD  omeprazole (PRILOSEC) 20 MG capsule Take 20 mg by mouth daily.   Yes Historical Provider, MD  potassium chloride (K-DUR) 10 MEQ tablet Take 10 mEq by mouth  daily.   Yes Historical Provider, MD  rOPINIRole (REQUIP) 0.5 MG tablet Take 0.5 mg by mouth 2 (two) times daily.   Yes Historical Provider, MD  sitaGLIPtin (JANUVIA) 25 MG tablet Take 25 mg by mouth daily.   Yes Historical Provider, MD  vitamin B-12 (CYANOCOBALAMIN) 1000 MCG tablet Take 1,000 mcg by mouth daily.   Yes Historical Provider, MD      DRUG ALLERGIES: Allergies  Allergen Reactions  . Biaxin [Clarithromycin]    . Lipitor [Atorvastatin]   . Lovastatin   . Nabumetone   . Penicillins     Has patient had a PCN reaction causing immediate rash, facial/tongue/throat swelling, SOB or lightheadedness with hypotension: No Has patient had a PCN reaction causing severe rash involving mucus membranes or skin necrosis: No Has patient had a PCN reaction that required hospitalization No Has patient had a PCN reaction occurring within the last 10 years: No If all of the above answers are "NO", then may proceed with Cephalosporin use.  Laurita Quint   . Zantac [Ranitidine Hcl]      REVIEW OF SYSTEMS: CONSTITUTIONAL: No fever/chills, fatigue, weakness, weight gain/loss, headache EYES: No blurry or double vision. ENT: No tinnitus, postnasal drip, redness or soreness of the oropharynx. RESPIRATORY: No cough, wheeze, hemoptysis, dyspnea. CARDIOVASCULAR: No chest pain, orthopnea, palpitations, syncope. GASTROINTESTINAL: Positive nausea, vomiting. Negative constipation, diarrhea,  abdominal pain, hematemesis, melena or hematochezia. GENITOURINARY: No dysuria or hematuria. ENDOCRINE: No polyuria or nocturia. No heat or cold intolerance. HEMATOLOGY: No anemia, bruising, bleeding. INTEGUMENTARY: No rashes, ulcers, lesions. MUSCULOSKELETAL: No arthritis, swelling, gout. Positive discomfort bilateral lower extremities secondary to restless legs NEUROLOGIC: No numbness, tingling, weakness or ataxia. No seizure-type activity. PSYCHIATRIC: No anxiety, depression, insomnia.  PHYSICAL EXAMINATION: VITAL SIGNS: Blood pressure (!) 152/64, pulse (!) 110, temperature 98.7 F (37.1 C), temperature source Oral, resp. rate 15, height 4\' 11"  (1.499 m), weight 55.4 kg (122 lb 1.6 oz), SpO2 92 %.  GENERAL: 82 y.o.-year-old white female patient, well-developed, well-nourished lying in the bed in no acute distress.  Pleasant and cooperative.   HEENT: Head atraumatic, normocephalic. Pupils equal, round, reactive to light and  accommodation. No scleral icterus. Extraocular muscles intact. Nares are patent. Oropharynx is clear. Mucus membranes dry NECK: Supple, full range of motion. No JVD, no bruit heard. No thyroid enlargement, no tenderness, no cervical lymphadenopathy. CHEST: Normal breath sounds bilaterally. No wheezing, rales, rhonchi or crackles. No use of accessory muscles of respiration.  No reproducible chest wall tenderness.  CARDIOVASCULAR: S1, S2 normal. No murmurs, rubs, or gallops. Cap refill <2 seconds. ABDOMEN: Soft, nontender, nondistended. No rebound, guarding, rigidity. Normoactive bowel sounds present in all four quadrants. No organomegaly or mass. EXTREMITIES: Full range of motion. No pedal edema, cyanosis, or clubbing. Negative Homans sign. NEUROLOGIC: Cranial nerves II through XII are grossly intact with no focal sensorimotor deficit. Muscle strength 5/5 in all extremities. Sensation intact. Gait not checked. PSYCHIATRIC: The patient is alert and oriented x 3. Normal affect, mood, thought content. SKIN: Warm, dry, and intact without obvious rash, lesion, or ulcer.  LABORATORY PANEL:  CBC  Recent Labs Lab 02/04/16 2042  WBC 8.3  HGB 13.0  HCT 38.2  PLT 174   ----------------------------------------------------------------------------------------------------------------- Chemistries  Recent Labs Lab 02/04/16 2042  NA 137  K 3.9  CL 98*  CO2 28  GLUCOSE 105*  BUN 31*  CREATININE 1.59*  CALCIUM 8.9  AST 19  ALT 28  ALKPHOS 85  BILITOT 0.7   ------------------------------------------------------------------------------------------------------------------ Cardiac Enzymes  Recent Labs Lab 02/04/16 2042  TROPONINI <0.03   ------------------------------------------------------------------------------------------------------------------  RADIOLOGY: Ct Abdomen Pelvis Wo Contrast  Result Date: 02/05/2016 CLINICAL DATA:  Nausea for 1 week.  Refusing to eat at home. EXAM: CT  ABDOMEN AND PELVIS WITHOUT CONTRAST TECHNIQUE: Multidetector CT imaging of the abdomen and pelvis was performed following the standard protocol without IV contrast. COMPARISON:  None. FINDINGS: Lower chest: Mild linear scarring in both lung bases. Hepatobiliary: Cholecystectomy. Liver and bile ducts appear unremarkable. Pancreas: Atrophic.  Otherwise unremarkable. Spleen: Normal Adrenals/Urinary Tract: Mild uniform thickening of both adrenals, without discrete mass or nodule. Both kidneys appear atrophic. No significant renal parenchymal masses are evident. No hydronephrosis. Ureters and urinary bladder appear unremarkable. Stomach/Bowel: Marked gastric distention. Duodenum is also distended. Oral contrast has entered the jejunum. Colon is remarkable only for extensive diverticulosis. Vascular/Lymphatic: The abdominal aorta is normal in caliber. Extensive atherosclerotic calcification is present throughout. Reproductive: Hysterectomy.  No adnexal abnormality. Other: No focal inflammatory changes are evident in the abdomen or pelvis. No adenopathy. No ascites. Musculoskeletal: No significant skeletal lesion. Severe lumbar degenerative disc and facet changes are present at multiple levels. Small fat containing umbilical hernia incidentally noted. IMPRESSION: Marked gastric distention. Duodenum is also moderately distended but oral contrast does enter the jejunum. No extraluminal air. Electronically Signed   By: Ellery Plunk M.D.   On: 02/05/2016 00:21    EKG: A. fib with normal axis, nonspecific ST and T wave changes  IMPRESSION AND PLAN:  This is a 82 y.o. female with a history of hypertension, hyperlipidemia, diabetes, restless legs now being admitted with: 1. Dehydration secondary to intractable vomiting. Etiology is likely viral. We'll admit for IV fluid hydration, antiemetics and administration of medication. Patient will be nothing by mouth and advance diet as tolerated. 2. Atrial fibrillation,  possibly new onset. Previous records show no indication that patient has ever had atrial fibrillation before. New A. fib may be related to dehydration and lack of medications. We'll monitor on telemetry overnight and consider further workup if warranted. 3. Hypertensive urgency-likely multifactorial secondary to recent illness and inability to take antihypertensives. We'll treat with IV Lopressor. 4. Diabetes-patient will be nothing by mouth therefore we'll half her dose of Lantus and cover with insulin sliding scale coverage.   Diet/Nutrition: Nothing by mouth Fluids: IV normal saline DVT Px: Lovenox, SCDs and early ambulation Code Status: DO NOT RESUSCITATE-confirmed with patient and her healthcare proxy Ray Corningham  All the records are reviewed and case discussed with ED provider. Management plans discussed with the patient and/or family who express understanding and agree with plan of care.   TOTAL TIME TAKING CARE OF THIS PATIENT: 60 minutes.   Parminder Trapani D.O. on 02/05/2016 at 1:31 AM Between 7am to 6pm - Pager - [phone] After 6pm go to [email or web address] - Social research officer, government [facility] [place] [facility] Office [phone] CC: Primary care physician; Barbette Reichmann, MD     Note: This dictation was prepared with Dragon dictation along with smaller phrase technology. Any transcriptional errors that result from this process are unintentional.

## 2016-02-05 NOTE — Progress Notes (Signed)
Pt's BG 56. Order placed for 1 amp D50 IV.

## 2016-02-05 NOTE — Progress Notes (Signed)
U/A collected and sent.

## 2016-02-05 NOTE — NC FL2 (Signed)
[place] MEDICAID FL2 LEVEL OF CARE SCREENING TOOL     IDENTIFICATION  Patient Name: Morgan Howard Birthdate: Feb 28, 1915 Sex: female Admission Date (Current Location): 02/04/2016  [facility] and [place] Number:  Chiropodist and Address:  [facility], [address] [place], [place] [address]      Provider Number: [id number]  Attending Physician Name and Address:  Houston Siren, MD  Relative Name and Phone Number:       Current Level of Care: Hospital Recommended Level of Care: Skilled Nursing Facility Prior Approval Number:    Date Approved/Denied:   PASRR Number:    Discharge Plan: SNF    Current Diagnoses: Patient Active Problem List   Diagnosis Date Noted  . Dehydration 02/05/2016  . Diabetes (HCC) 01/01/2016  . Hypoglycemia 01/01/2016  . Vomiting 01/01/2016  . GERD (gastroesophageal reflux disease) 01/01/2016    Orientation RESPIRATION BLADDER Height & Weight     Self, Time, Situation, Place  Normal Continent Weight: 122 lb 1.6 oz (55.4 kg) Height:  4\' 11"  (149.9 cm)  BEHAVIORAL SYMPTOMS/MOOD NEUROLOGICAL BOWEL NUTRITION STATUS   (None. )  (None.) Continent Diet (Clear Liquid Diet)  AMBULATORY STATUS COMMUNICATION OF NEEDS Skin   Extensive Assist Verbally Normal                       Personal Care Assistance Level of Assistance  Bathing, Feeding, Dressing Bathing Assistance: Independent Feeding assistance: Limited assistance Dressing Assistance: Independent     Functional Limitations Info  Sight, Speech, Hearing Sight Info: Adequate Hearing Info: Adequate Speech Info: Adequate    SPECIAL CARE FACTORS FREQUENCY  PT (By licensed PT), OT (By licensed OT)     PT Frequency:  (5) OT Frequency:  (5)            Contractures      Additional Factors Info  Code Status, Allergies, Insulin Sliding Scale Code Status Info:  (Full Code ) Allergies Info:  (Allergies: Biaxin (Clarithromycin), Lipitor  Atorvastatin, Lovastatin,  Nabumetone, Penicillins, Psoralea, Zantac Ranitidine Hcl)   Insulin Sliding Scale Info:  (NovoLog Insulin )       Current Medications (02/05/2016):  This is the current hospital active medication list Current Facility-Administered Medications  Medication Dose Route Frequency Provider Last Rate Last Dose  . 0.9 %  sodium chloride infusion   Intravenous Continuous Houston Siren, MD 125 mL/hr at 02/05/16 1438 125 mL/hr at 02/05/16 1438  . acetaminophen (TYLENOL) tablet 650 mg  650 mg Oral Q6H PRN Alexis Hugelmeyer, DO       Or  . acetaminophen (TYLENOL) suppository 650 mg  650 mg Rectal Q6H PRN Alexis Hugelmeyer, DO      . aspirin EC tablet 81 mg  81 mg Oral Daily Alexis Hugelmeyer, DO      . bisacodyl (DULCOLAX) EC tablet 5 mg  5 mg Oral Daily PRN Alexis Hugelmeyer, DO      . docusate sodium (COLACE) capsule 100 mg  100 mg Oral Daily Alexis Hugelmeyer, DO      . doxazosin (CARDURA) tablet 2 mg  2 mg Oral Daily Alexis Hugelmeyer, DO   2 mg at 02/05/16 1440  . ferrous sulfate tablet 325 mg  325 mg Oral Q breakfast Alexis Hugelmeyer, DO      . heparin injection 5,000 Units  5,000 Units Subcutaneous Q8H Alexis Hugelmeyer, DO   5,000 Units at 02/05/16 0503  . insulin aspart (novoLOG) injection 0-5 Units  0-5  Units Subcutaneous QHS Alexis Hugelmeyer, DO      . insulin aspart (novoLOG) injection 0-9 Units  0-9 Units Subcutaneous TID WC Alexis Hugelmeyer, DO      . losartan (COZAAR) tablet 25 mg  25 mg Oral Daily Alexis Hugelmeyer, DO   25 mg at 02/05/16 1441  . magnesium citrate solution 1 Bottle  1 Bottle Oral Once PRN Alexis Hugelmeyer, DO      . ondansetron (ZOFRAN) tablet 4 mg  4 mg Oral Q6H PRN Alexis Hugelmeyer, DO       Or  . ondansetron (ZOFRAN) injection 4 mg  4 mg Intravenous Q6H PRN Alexis Hugelmeyer, DO   4 mg at 02/05/16 0931  . ondansetron (ZOFRAN) injection 8 mg  8 mg Intravenous Once Arnaldo NatalPaul F Malinda, MD      . pantoprazole (PROTONIX) EC tablet 40 mg  40 mg  Oral QAC breakfast Alexis Hugelmeyer, DO      . potassium chloride (K-DUR,KLOR-CON) CR tablet 10 mEq  10 mEq Oral Daily Alexis Hugelmeyer, DO      . promethazine (PHENERGAN) injection 12.5 mg  12.5 mg Intravenous Q6H PRN Alexis Hugelmeyer, DO      . rOPINIRole (REQUIP) tablet 0.5 mg  0.5 mg Oral BID Alexis Hugelmeyer, DO   0.5 mg at 02/05/16 0225  . senna-docusate (Senokot-S) tablet 1 tablet  1 tablet Oral QHS PRN Alexis Hugelmeyer, DO      . vitamin B-12 (CYANOCOBALAMIN) tablet 1,000 mcg  1,000 mcg Oral Daily Alexis Hugelmeyer, DO         Discharge Medications: Please see discharge summary for a list of discharge medications.  Relevant Imaging Results:  Relevant Lab Results:   Additional Information  (SSN: [id number])  Ralene BatheMackenzie Arley Salamone, Student-Social Work [phone]

## 2016-02-05 NOTE — Progress Notes (Signed)
Initial Nutrition Assessment  DOCUMENTATION CODES:   Not applicable  INTERVENTION:  -Recommend once diet progressed past clear liquids, glucerna BID between meals for added nutrition -Encouraged good sources of protein once pt able to tolerate solid foods. Discussed with caregiver at bedside.  Verbalized understanding   NUTRITION DIAGNOSIS:   Inadequate oral intake related to altered GI function as evidenced by per patient/family report.    GOAL:   Patient will meet greater than or equal to 90% of their needs    MONITOR:   PO intake, Diet advancement, Supplement acceptance, Weight trends  REASON FOR ASSESSMENT:   Consult    ASSESSMENT:     82 y.o. Female admitted with FTT, nausea, vomiting likely viral enteritis per MD note. Still with nausea and vomiting today.    Caregiver at bedside and reports intake has decreased over the last week secondary to abdominal pain, nausea, vomiting. Caregiver reports pt typically drinks glucerna daily  Medications reviewed: aspart insulin, vit b 12, NS at 11925ml/hr Labs reviewed: BUN 28, creatinine 1.59  Nutrition-Focused physical exam completed. Findings are mild in orbital region fat depletion, mild/moderate muscle depletion, and no edema.     Diet Order:  Diet clear liquid Room service appropriate? Yes; Fluid consistency: Thin  Skin:  Reviewed, no issues  Last BM:  9/11  Height:   Ht Readings from Last 1 Encounters:  02/04/16 4\' 11"  (1.499 m)    Weight: Caregiver unsure about wt loss. Chart reviewed and noted wt gain since last wt of 119 pounds on 8/11  Wt Readings from Last 1 Encounters:  02/04/16 122 lb 1.6 oz (55.4 kg)    Ideal Body Weight:     BMI:  Body mass index is 24.66 kg/m.  Estimated Nutritional Needs:   Kcal:  [id number] kcals/d  Protein:  66-83 g/d  Fluid:  >/=131775ml/d  EDUCATION NEEDS:   Education needs addressed  Trachelle Low B. Freida BusmanAllen, RD, LDN [phone] (pager) Weekend/On-Call pager  [phone])

## 2016-02-05 NOTE — Progress Notes (Addendum)
New Admission Note:   Arrival Method: per stretcher from ED, pt came from home Mental Orientation: alert and oriented X4 Telemetry: placed on box 4047, CCMD notified, verified with RN French Anaracy Assessment: Completed Skin: warm, dry, with redness and flakiness noted on the sacrum, sacral foam dressing applied, with abrasion noted on the right lower leg IV: G20 on the right AC with transparent dressing, intact Pain: denies any pain as of this time Safety Measures: Safety Fall Prevention Plan has been given and discussed Admission: Completed 1A Orientation: Patient has been oriented to the room, unit and staff.  Family: POA/friend Ray at bedside  Orders have been reviewed and implemented. Will continue to monitor the patient. Call light has been placed within reach and bed alarm has been activated.   Janice NorrieAnessa Jackelin Correia BSN, RN [facility] 1A

## 2016-02-05 NOTE — Progress Notes (Signed)
Pt continues be nauseous with emesis, pt also has c/o right rib pain. Pts BP 165/93. Pts POA at the bedside and has concerns that pt is still not feeling well. Questioning if GI consult has been ordered, also requesting UA results. MD Sudini notified of the above. Orders received for in and out cath X1 to collect urine. PRN order for labetalol for BP > 170 see MAR.

## 2016-02-05 NOTE — Progress Notes (Signed)
[employer] [facility] - [place] at [facility]   PATIENT NAME: Morgan Howard    MR#:  [id number]  DATE OF BIRTH:  06/15/1914  SUBJECTIVE:   Patient here due to intractable nausea and vomiting and noted to be dehydrated. Still having some nausea today. No diarrhea. No vomiting noted overnight. Caretaker at bedside.  REVIEW OF SYSTEMS:    Review of Systems  Constitutional: Positive for malaise/fatigue. Negative for chills and fever.  HENT: Negative for congestion and tinnitus.   Eyes: Negative for blurred vision and double vision.  Respiratory: Negative for cough, shortness of breath and wheezing.   Cardiovascular: Negative for chest pain, orthopnea and PND.  Gastrointestinal: Positive for nausea. Negative for abdominal pain, diarrhea and vomiting.  Genitourinary: Negative for dysuria and hematuria.  Neurological: Positive for weakness. Negative for dizziness, sensory change and focal weakness.  All other systems reviewed and are negative.   Nutrition: Clear Liquids Tolerating Diet: Yes Tolerating PT: Await Eval.    DRUG ALLERGIES:   Allergies  Allergen Reactions  . Biaxin [Clarithromycin]   . Lipitor [Atorvastatin]   . Lovastatin   . Nabumetone   . Penicillins     Has patient had a PCN reaction causing immediate rash, facial/tongue/throat swelling, SOB or lightheadedness with hypotension: No Has patient had a PCN reaction causing severe rash involving mucus membranes or skin necrosis: No Has patient had a PCN reaction that required hospitalization No Has patient had a PCN reaction occurring within the last 10 years: No If all of the above answers are "NO", then may proceed with Cephalosporin use.  Laurita Quint   . Zantac [Ranitidine Hcl]     VITALS:  Blood pressure (!) 131/56, pulse 95, temperature 98.4 F (36.9 C), resp. rate 15, height 4\' 11"  (1.499 m), weight 55.4 kg (122 lb 1.6 oz), SpO2 98 %.  PHYSICAL EXAMINATION:   Physical Exam  GENERAL:  82  y.o.-year-old patient lying in the bed in no acute distress.  EYES: Pupils equal, round, reactive to light and accommodation. No scleral icterus. Extraocular muscles intact.  HEENT: Head atraumatic, normocephalic. Oropharynx and nasopharynx clear.  NECK:  Supple, no jugular venous distention. No thyroid enlargement, no tenderness.  LUNGS: Normal breath sounds bilaterally, no wheezing, rales, rhonchi. No use of accessory muscles of respiration.  CARDIOVASCULAR: S1, S2 normal. No murmurs, rubs, or gallops.  ABDOMEN: Soft, nontender, Slightly distended. Bowel sounds present. No organomegaly or mass.  EXTREMITIES: No cyanosis, clubbing or edema b/l.    NEUROLOGIC: Cranial nerves II through XII are intact. No focal Motor or sensory deficits b/l.   PSYCHIATRIC: The patient is alert and oriented x 3.  SKIN: No obvious rash, lesion, or ulcer.    LABORATORY PANEL:   CBC  Recent Labs Lab 02/05/16 0352  WBC 11.0  HGB 12.9  HCT 38.4  PLT 171   ------------------------------------------------------------------------------------------------------------------  Chemistries   Recent Labs Lab 02/04/16 2042 02/05/16 0352  NA 137 138  K 3.9 3.9  CL 98* 102  CO2 28 27  GLUCOSE 105* 92  BUN 31* 28*  CREATININE 1.59* 1.59*  CALCIUM 8.9 8.5*  MG 2.6*  --   AST 19  --   ALT 28  --   ALKPHOS 85  --   BILITOT 0.7  --    ------------------------------------------------------------------------------------------------------------------  Cardiac Enzymes  Recent Labs Lab 02/04/16 2042  TROPONINI <0.03   ------------------------------------------------------------------------------------------------------------------  RADIOLOGY:  Ct Abdomen Pelvis Wo Contrast  Result Date: 02/05/2016 CLINICAL DATA:  Nausea for 1 week.  Refusing to eat at home. EXAM: CT ABDOMEN AND PELVIS WITHOUT CONTRAST TECHNIQUE: Multidetector CT imaging of the abdomen and pelvis was performed following the standard  protocol without IV contrast. COMPARISON:  None. FINDINGS: Lower chest: Mild linear scarring in both lung bases. Hepatobiliary: Cholecystectomy. Liver and bile ducts appear unremarkable. Pancreas: Atrophic.  Otherwise unremarkable. Spleen: Normal Adrenals/Urinary Tract: Mild uniform thickening of both adrenals, without discrete mass or nodule. Both kidneys appear atrophic. No significant renal parenchymal masses are evident. No hydronephrosis. Ureters and urinary bladder appear unremarkable. Stomach/Bowel: Marked gastric distention. Duodenum is also distended. Oral contrast has entered the jejunum. Colon is remarkable only for extensive diverticulosis. Vascular/Lymphatic: The abdominal aorta is normal in caliber. Extensive atherosclerotic calcification is present throughout. Reproductive: Hysterectomy.  No adnexal abnormality. Other: No focal inflammatory changes are evident in the abdomen or pelvis. No adenopathy. No ascites. Musculoskeletal: No significant skeletal lesion. Severe lumbar degenerative disc and facet changes are present at multiple levels. Small fat containing umbilical hernia incidentally noted. IMPRESSION: Marked gastric distention. Duodenum is also moderately distended but oral contrast does enter the jejunum. No extraluminal air. Electronically Signed   By: Ellery Plunkaniel R Mitchell M.D.   On: 02/05/2016 00:21     ASSESSMENT AND PLAN:   82 year old female with past medical history of hypertension, hyperlipidemia, diabetes, history of psoriasis who presented to the hospital due to intractable nausea and vomiting.   1. Intractable nausea and vomiting-etiology unclear. Suspected to be secondary to a viral enteritis. -CT abdomen pelvis showing only some gaseous distention but no other acute pathology. Still continues to have some mild nausea but no acute vomiting. Patient denies any diarrhea. -Continue supportive care with IV fluids, antiemetics. We'll start on clear liquid diet. If not improving  consider a gastroenterology consult.  2. DM - cont. SSI and follow BS -  Was a bit hypoglycemic this a.m. Will d/c lantus, Tradjenta  3. HTN - cont. Metoprolol, Losartan.   4. GERD - cont. Protonix.   5. Restless Leg syndrome - cont. Requip.     All the records are reviewed and case discussed with Care Management/Social Worker. Management plans discussed with the patient, family and they are in agreement.  CODE STATUS: Full Code  DVT Prophylaxis: Heparin SQ  TOTAL TIME TAKING CARE OF THIS PATIENT: 30 minutes.   POSSIBLE D/C IN 1-2 DAYS, DEPENDING ON CLINICAL CONDITION.   Houston SirenSAINANI,Brodin Gelpi J M.D on 02/05/2016 at 1:45 PM  Between 7am to 6pm - Pager - [phone]  After 6pm go to [email or web address] - Scientist, research (life sciences)password [email or web address]  [facility] [place] [facility]  Office  [phone]  CC: Primary care physician; Barbette ReichmannHANDE,VISHWANATH, MD

## 2016-02-05 NOTE — ED Notes (Signed)
Dr. Hugelmyer at bedside.  

## 2016-02-05 NOTE — Progress Notes (Signed)
Tele shows runs of non-sustained v-tach and a-fib with pvc's. No known cardiac history. Pt asymptomatic at this time.

## 2016-02-06 ENCOUNTER — Inpatient Hospital Stay: Payer: Medicare Other

## 2016-02-06 DIAGNOSIS — R1110 Vomiting, unspecified: Secondary | ICD-10-CM

## 2016-02-06 LAB — BASIC METABOLIC PANEL
Anion gap: 11 (ref 5–15)
BUN: 22 mg/dL — AB (ref 6–20)
CALCIUM: 8.2 mg/dL — AB (ref 8.9–10.3)
CO2: 25 mmol/L (ref 22–32)
CREATININE: 1.51 mg/dL — AB (ref 0.44–1.00)
Chloride: 105 mmol/L (ref 101–111)
GFR calc Af Amer: 32 mL/min — ABNORMAL LOW (ref >60)
GFR calc non Af Amer: 27 mL/min — ABNORMAL LOW (ref >60)
GLUCOSE: 82 mg/dL (ref 65–99)
Potassium: 3.5 mmol/L (ref 3.5–5.1)
Sodium: 141 mmol/L (ref 135–145)

## 2016-02-06 LAB — GLUCOSE, CAPILLARY
GLUCOSE-CAPILLARY: 123 mg/dL — AB (ref 65–99)
Glucose-Capillary: 94 mg/dL (ref 65–99)
Glucose-Capillary: 146 mg/dL — ABNORMAL HIGH (ref 65–99)
Glucose-Capillary: 111 mg/dL — ABNORMAL HIGH (ref 65–99)

## 2016-02-06 MED ORDER — MORPHINE SULFATE (PF) 2 MG/ML IV SOLN
0.5000 mg | INTRAVENOUS | Status: DC | PRN
  Administered 2016-02-06 – 2016-02-07 (×4): 0.5 mg via INTRAVENOUS
  Filled 2016-02-06 (×4): qty 1

## 2016-02-06 MED ORDER — HYDRALAZINE HCL 20 MG/ML IJ SOLN
10.0000 mg | Freq: Four times a day (QID) | INTRAMUSCULAR | Status: DC | PRN
  Administered 2016-02-06: 10 mg via INTRAVENOUS
  Filled 2016-02-06: qty 1

## 2016-02-06 MED ORDER — METOCLOPRAMIDE HCL 5 MG/5ML PO SOLN
10.0000 mg | Freq: Four times a day (QID) | ORAL | Status: DC | PRN
  Administered 2016-02-06: 10 mg via ORAL
  Filled 2016-02-06 (×3): qty 10

## 2016-02-06 NOTE — Progress Notes (Signed)
Dr Cherlynn KaiserSainani called and notified of pt's high BP as documented and interventions without effectiveness. Also notified of pt not taking PO meds or food. New orders obtained.

## 2016-02-06 NOTE — Progress Notes (Signed)
NG tube placed as charted to LWIS set at 60. Bilious output noted.

## 2016-02-06 NOTE — Consult Note (Signed)
Patient ID: Morgan Howard, female   DOB: Sep 09, 1914, 82 y.o.   MRN: [id number]  HPI Morgan Howard is a 82 y.o. female seen in consultation for possible ileus. She has a history of diabetes and hypertension. And there is new onset of atrial fibrillation. She states that about 5 days ago or so started having significant nausea and vomiting. An she reports some bilious emesis and surprisingly no abdominal pain. She has had decreased by mouth intake. She has been passing gas with the last flatus was last night. Last bowel movement was about 5 days ago or so. Abdominal operations including hysterectomy appendectomy and cholecystectomy. She surprisingly very functional for 82 year old female she lives in assisted living and is able to walk with a walker is able to bath herself  For the most part , She has continence of her sphincters. She is oriented and she has a pristine mine. He is able to provide me with a full history. I have reviewed all the imaging study showing evidence of dilator stomach and duodenum. There is no major evidence of dilated loop of small bowel. On the x-ray today there is evidence of gastric dilation but there is actually evidence of contrast into the colon.   HPI  Past Medical History:  Diagnosis Date  . Diabetes (HCC)   . HLD (hyperlipidemia)   . HTN (hypertension)   . Psoriasis     Past Surgical History:  Procedure Laterality Date  . ABDOMINAL HYSTERECTOMY    . APPENDECTOMY    . CHOLECYSTECTOMY      Family History  Problem Relation Age of Onset  . Stroke Mother   . Heart attack Father   . Breast cancer Sister     Social History Social History  Substance Use Topics  . Smoking status: Never Smoker  . Smokeless tobacco: Never Used  . Alcohol use No    Allergies  Allergen Reactions  . Biaxin [Clarithromycin]   . Lipitor [Atorvastatin]   . Lovastatin   . Nabumetone   . Penicillins     Has patient had a PCN reaction causing immediate rash,  facial/tongue/throat swelling, SOB or lightheadedness with hypotension: No Has patient had a PCN reaction causing severe rash involving mucus membranes or skin necrosis: No Has patient had a PCN reaction that required hospitalization No Has patient had a PCN reaction occurring within the last 10 years: No If all of the above answers are "NO", then may proceed with Cephalosporin use.  Laurita Quint   . Zantac [Ranitidine Hcl]     Current Facility-Administered Medications  Medication Dose Route Frequency Provider Last Rate Last Dose  . 0.9 %  sodium chloride infusion   Intravenous Continuous Houston Siren, MD 75 mL/hr at 02/06/16 1537    . acetaminophen (TYLENOL) tablet 650 mg  650 mg Oral Q6H PRN Alexis Hugelmeyer, DO       Or  . acetaminophen (TYLENOL) suppository 650 mg  650 mg Rectal Q6H PRN Alexis Hugelmeyer, DO      . aspirin EC tablet 81 mg  81 mg Oral Daily Alexis Hugelmeyer, DO      . bisacodyl (DULCOLAX) EC tablet 5 mg  5 mg Oral Daily PRN Alexis Hugelmeyer, DO      . docusate sodium (COLACE) capsule 100 mg  100 mg Oral Daily Alexis Hugelmeyer, DO      . doxazosin (CARDURA) tablet 2 mg  2 mg Oral Daily Alexis Hugelmeyer, DO   2 mg at 02/05/16 1440  .  ferrous sulfate tablet 325 mg  325 mg Oral Q breakfast Alexis Hugelmeyer, DO      . heparin injection 5,000 Units  5,000 Units Subcutaneous Q8H Alexis Hugelmeyer, DO   5,000 Units at 02/06/16 1407  . hydrALAZINE (APRESOLINE) injection 10 mg  10 mg Intravenous Q6H PRN Houston SirenVivek J Sainani, MD   10 mg at 02/06/16 0844  . insulin aspart (novoLOG) injection 0-5 Units  0-5 Units Subcutaneous QHS Alexis Hugelmeyer, DO      . insulin aspart (novoLOG) injection 0-9 Units  0-9 Units Subcutaneous TID WC Alexis Hugelmeyer, DO   1 Units at 02/06/16 1627  . labetalol (NORMODYNE,TRANDATE) injection 10 mg  10 mg Intravenous Q4H PRN Milagros LollSrikar Sudini, MD   10 mg at 02/06/16 0750  . losartan (COZAAR) tablet 25 mg  25 mg Oral Daily Alexis Hugelmeyer, DO   25 mg at  02/05/16 1441  . magnesium citrate solution 1 Bottle  1 Bottle Oral Once PRN Alexis Hugelmeyer, DO      . metoCLOPramide (REGLAN) 5 MG/5ML solution 10 mg  10 mg Oral Q6H PRN Houston SirenVivek J Sainani, MD   10 mg at 02/06/16 1035  . morphine 2 MG/ML injection 0.5 mg  0.5 mg Intravenous Q3H PRN Houston SirenVivek J Sainani, MD   0.5 mg at 02/06/16 1024  . ondansetron (ZOFRAN) tablet 4 mg  4 mg Oral Q6H PRN Alexis Hugelmeyer, DO       Or  . ondansetron (ZOFRAN) injection 4 mg  4 mg Intravenous Q6H PRN Alexis Hugelmeyer, DO   4 mg at 02/06/16 0750  . ondansetron (ZOFRAN) injection 8 mg  8 mg Intravenous Once Arnaldo NatalPaul F Malinda, MD      . pantoprazole (PROTONIX) EC tablet 40 mg  40 mg Oral QAC breakfast Alexis Hugelmeyer, DO      . potassium chloride (K-DUR,KLOR-CON) CR tablet 10 mEq  10 mEq Oral Daily Alexis Hugelmeyer, DO      . promethazine (PHENERGAN) injection 12.5 mg  12.5 mg Intravenous Q6H PRN Alexis Hugelmeyer, DO   12.5 mg at 02/06/16 0631  . rOPINIRole (REQUIP) tablet 0.5 mg  0.5 mg Oral BID Alexis Hugelmeyer, DO   0.5 mg at 02/05/16 2210  . senna-docusate (Senokot-S) tablet 1 tablet  1 tablet Oral QHS PRN Alexis Hugelmeyer, DO      . vitamin B-12 (CYANOCOBALAMIN) tablet 1,000 mcg  1,000 mcg Oral Daily Alexis Hugelmeyer, DO         Review of Systems A 10 point review of systems was asked and was negative except for the information on the HPI  Physical Exam Blood pressure (!) 119/54, pulse (!) 107, temperature 98.6 F (37 C), temperature source Oral, resp. rate 16, height 4\' 11"  (1.499 m), weight 55.4 kg (122 lb 1.6 oz), SpO2 93 %. CONSTITUTIONAL: Elderly female EYES: Pupils are equal, round, and reactive to light, Sclera are non-icteric. EARS, NOSE, MOUTH AND THROAT: The oropharynx is clear. The oral mucosa is pink and moist. Hearing is intact to voice. LYMPH NODES:  Lymph nodes in the neck are normal. RESPIRATORY:  Lungs are clear. There is normal respiratory effort, with equal breath sounds bilaterally, and  without pathologic use of accessory muscles. CARDIOVASCULAR: Heart is regular without murmurs, gallops, or rubs. GI: The abdomen is  soft, nontender, and  Mildly distended. There are no palpable masses. There is no hepatosplenomegaly. There are normal bowel sounds in all quadrants.No peritonitis after manipulation of NGT about 900cc came out and her abdomen was flat. GU: Rectal  deferred.   MUSCULOSKELETAL: Normal muscle strength and tone. No cyanosis or edema.   SKIN: Turgor is good and there are no pathologic skin lesions or ulcers. NEUROLOGIC: Motor and sensation is grossly normal. Cranial nerves are grossly intact. PSYCH:  Oriented to person, place and time. Affect is normal.  Data Reviewed I have personally reviewed the patient's imaging, laboratory findings and medical records.    Assessment/Plan Severe  gastroparesis from diabetes and multiple comorbidities. Another etiology might be ileus. Although the CT scan findings are more consistent with dilation of the stomach than with any particular areas of the small bowel. There is no evidence of pneumatosis or evidence of ischemic bowel. And no need for surgical intervention at this point. Recommend continuation of IV fluids, nothing by mouth and NG tube. We'll continue to follow. I have discussed the case in detail with her and power of attorney who is very appreciative  Sterling Big, MD FACS General Surgeon 02/06/2016, 8:49 PM

## 2016-02-06 NOTE — Care Management Important Message (Signed)
Important Message  Patient Details  Name: Lucita Loradith R Soloway MRN: [id number] Date of Birth: Feb 01, 1915   Medicare Important Message Given:  Yes    [name] Karley Pho, RN 02/06/2016, 11:19 AM

## 2016-02-06 NOTE — Progress Notes (Signed)
[employer] [facility] - [place] [facility]   PATIENT NAME: Morgan Howard    MR#:  [id number]  DATE OF BIRTH:  03/26/1915  SUBJECTIVE:   She continues to have persistent nausea and vomiting. Abdominal x-ray obtained today still showing gaseous distention with mild duodenal distention. NG tube has been placed. Surgical consult obtained. Patient was very restless this morning when seen. Has not been able to tolerate any oral diet since yesterday.  REVIEW OF SYSTEMS:    Review of Systems  Constitutional: Positive for malaise/fatigue. Negative for chills and fever.  HENT: Negative for congestion and tinnitus.   Eyes: Negative for blurred vision and double vision.  Respiratory: Negative for cough, shortness of breath and wheezing.   Cardiovascular: Negative for chest pain, orthopnea and PND.  Gastrointestinal: Positive for nausea and vomiting. Negative for abdominal pain and diarrhea.  Genitourinary: Negative for dysuria and hematuria.  Neurological: Positive for weakness. Negative for dizziness, sensory change and focal weakness.  All other systems reviewed and are negative.   Nutrition: NPO Tolerating Diet: No Tolerating PT: Await Eval.    DRUG ALLERGIES:   Allergies  Allergen Reactions  . Biaxin [Clarithromycin]   . Lipitor [Atorvastatin]   . Lovastatin   . Nabumetone   . Penicillins     Has patient had a PCN reaction causing immediate rash, facial/tongue/throat swelling, SOB or lightheadedness with hypotension: No Has patient had a PCN reaction causing severe rash involving mucus membranes or skin necrosis: No Has patient had a PCN reaction that required hospitalization No Has patient had a PCN reaction occurring within the last 10 years: No If all of the above answers are "NO", then may proceed with Cephalosporin use.  Laurita Quint. Psoralea   . Zantac [Ranitidine Hcl]     VITALS:  Blood pressure (!) 122/47, pulse (!) 104, temperature 98 F (36.7 C), temperature source  Oral, resp. rate 16, height 4\' 11"  (1.499 m), weight 55.4 kg (122 lb 1.6 oz), SpO2 93 %.  PHYSICAL EXAMINATION:   Physical Exam  GENERAL:  82 y.o.-year-old patient lying in the bed in mild distress & restless in bed. EYES: Pupils equal, round, reactive to light and accommodation. No scleral icterus. Extraocular muscles intact.  HEENT: Head atraumatic, normocephalic. Oropharynx and nasopharynx clear.  NECK:  Supple, no jugular venous distention. No thyroid enlargement, no tenderness.  LUNGS: Normal breath sounds bilaterally, no wheezing, rales, rhonchi. No use of accessory muscles of respiration.  CARDIOVASCULAR: S1, S2 normal. No murmurs, rubs, or gallops.  ABDOMEN: Soft, nontender, Slightly distended. Bowel sounds Hypoactive. No organomegaly or mass.  EXTREMITIES: No cyanosis, clubbing or edema b/l.    NEUROLOGIC: Cranial nerves II through XII are intact. No focal Motor or sensory deficits b/l. Very Restless.  PSYCHIATRIC: The patient is alert and oriented x 3.  SKIN: No obvious rash, lesion, or ulcer.    LABORATORY PANEL:   CBC  Recent Labs Lab 02/05/16 0352  WBC 11.0  HGB 12.9  HCT 38.4  PLT 171   ------------------------------------------------------------------------------------------------------------------  Chemistries   Recent Labs Lab 02/04/16 2042  02/06/16 0415  NA 137  < > 141  K 3.9  < > 3.5  CL 98*  < > 105  CO2 28  < > 25  GLUCOSE 105*  < > 82  BUN 31*  < > 22*  CREATININE 1.59*  < > 1.51*  CALCIUM 8.9  < > 8.2*  MG 2.6*  --   --   AST 19  --   --  ALT 28  --   --   ALKPHOS 85  --   --   BILITOT 0.7  --   --   < > = values in this interval not displayed. ------------------------------------------------------------------------------------------------------------------  Cardiac Enzymes  Recent Labs Lab 02/04/16 2042  TROPONINI <0.03    ------------------------------------------------------------------------------------------------------------------  RADIOLOGY:  Ct Abdomen Pelvis Wo Contrast  Result Date: 02/05/2016 CLINICAL DATA:  Nausea for 1 week.  Refusing to eat at home. EXAM: CT ABDOMEN AND PELVIS WITHOUT CONTRAST TECHNIQUE: Multidetector CT imaging of the abdomen and pelvis was performed following the standard protocol without IV contrast. COMPARISON:  None. FINDINGS: Lower chest: Mild linear scarring in both lung bases. Hepatobiliary: Cholecystectomy. Liver and bile ducts appear unremarkable. Pancreas: Atrophic.  Otherwise unremarkable. Spleen: Normal Adrenals/Urinary Tract: Mild uniform thickening of both adrenals, without discrete mass or nodule. Both kidneys appear atrophic. No significant renal parenchymal masses are evident. No hydronephrosis. Ureters and urinary bladder appear unremarkable. Stomach/Bowel: Marked gastric distention. Duodenum is also distended. Oral contrast has entered the jejunum. Colon is remarkable only for extensive diverticulosis. Vascular/Lymphatic: The abdominal aorta is normal in caliber. Extensive atherosclerotic calcification is present throughout. Reproductive: Hysterectomy.  No adnexal abnormality. Other: No focal inflammatory changes are evident in the abdomen or pelvis. No adenopathy. No ascites. Musculoskeletal: No significant skeletal lesion. Severe lumbar degenerative disc and facet changes are present at multiple levels. Small fat containing umbilical hernia incidentally noted. IMPRESSION: Marked gastric distention. Duodenum is also moderately distended but oral contrast does enter the jejunum. No extraluminal air. Electronically Signed   By: Ellery Plunk M.D.   On: 02/05/2016 00:21   Dg Abd 1 View  Result Date: 02/06/2016 CLINICAL DATA:  Nausea and heaviness emesis for the past week. History of cholecystectomy abdominal hysterectomy, and appendectomy. EXAM: ABDOMEN - 1 VIEW  COMPARISON:  Abdominal and pelvic CT scan dated February 04, 2016 FINDINGS: The stomach remains distended with fluid and gas. There is air and fluid in the duodenal bulb region. There is no significance obstructive pattern observed elsewhere. There is contrast in the right colon. No definite free extraluminal gas. The lung bases are clear. IMPRESSION: Persistent gastric distention. Probable mild duodenal distention. Nasogastric suction may be useful. Electronically Signed   By: David  [place] M.D.   On: 02/06/2016 11:08     ASSESSMENT AND PLAN:   82 year old female with past medical history of hypertension, hyperlipidemia, diabetes, history of psoriasis who presented to the hospital due to intractable nausea and vomiting.   1. Intractable nausea and vomiting-etiology unclear. Suspected to be secondary to a viral enteritis/Ileus -Patient continues to have intractable nausea and vomiting without much improvement. Abdominal x-ray this morning showing persistent gastric and duodenal distention. NG tube has been placed.. -Continue supportive care with IV fluids, antiemetics.  Nothing by mouth today. -Surgical consult obtained and discussed with Dr. Excell Seltzer over the phone.  2. DM - cont. SSI. No further Hypoglycemia.  - BS stable.   3. HTN - BP a bit high today but pt. Cannot take Oral meds.  - PRN Hydralazine for now.    4. GERD - cont. Protonix.   5. Restless Leg syndrome - cont. Requip.  - very restless when seen earlier today.  PRN morphine ordered which as per nursing has helped.    All the records are reviewed and case discussed with Care Management/Social Worker. Management plans discussed with the patient, family and they are in agreement.  CODE STATUS: Full Code  DVT Prophylaxis: Heparin SQ  TOTAL  TIME TAKING CARE OF THIS PATIENT: 30 minutes.   POSSIBLE D/C IN 1-2 DAYS, DEPENDING ON CLINICAL CONDITION.   [place] Siren M.D on 02/06/2016 at 3:32 PM  Between 7am to 6pm -  Pager - [phone]  After 6pm go to [email or web address] - Scientist, research (life sciences) [place] [facility]  Office  [phone]  CC: Primary care physician; Barbette Reichmann, MD

## 2016-02-07 ENCOUNTER — Inpatient Hospital Stay: Payer: Medicare Other

## 2016-02-07 LAB — BASIC METABOLIC PANEL
ANION GAP: 12 (ref 5–15)
BUN: 31 mg/dL — AB (ref 6–20)
CALCIUM: 8 mg/dL — AB (ref 8.9–10.3)
CO2: 24 mmol/L (ref 22–32)
Chloride: 109 mmol/L (ref 101–111)
Creatinine, Ser: 1.79 mg/dL — ABNORMAL HIGH (ref 0.44–1.00)
GFR calc Af Amer: 26 mL/min — ABNORMAL LOW (ref >60)
GFR calc non Af Amer: 22 mL/min — ABNORMAL LOW (ref >60)
GLUCOSE: 122 mg/dL — AB (ref 65–99)
POTASSIUM: 3.4 mmol/L — AB (ref 3.5–5.1)
Sodium: 145 mmol/L (ref 135–145)

## 2016-02-07 LAB — GLUCOSE, CAPILLARY
GLUCOSE-CAPILLARY: 156 mg/dL — AB (ref 65–99)
GLUCOSE-CAPILLARY: 177 mg/dL — AB (ref 65–99)
Glucose-Capillary: 128 mg/dL — ABNORMAL HIGH (ref 65–99)
Glucose-Capillary: 183 mg/dL — ABNORMAL HIGH (ref 65–99)

## 2016-02-07 LAB — CBC
HEMATOCRIT: 34.2 % — AB (ref 35.0–47.0)
Hemoglobin: 11.6 g/dL — ABNORMAL LOW (ref 12.0–16.0)
MCH: 29.5 pg (ref 26.0–34.0)
MCHC: 33.8 g/dL (ref 32.0–36.0)
MCV: 87 fL (ref 80.0–100.0)
Platelets: 150 10*3/uL (ref 150–440)
RBC: 3.93 MIL/uL (ref 3.80–5.20)
RDW: 14.6 % — AB (ref 11.5–14.5)
WBC: 9.2 10*3/uL (ref 3.6–11.0)

## 2016-02-07 MED ORDER — PANTOPRAZOLE SODIUM 40 MG IV SOLR
40.0000 mg | Freq: Two times a day (BID) | INTRAVENOUS | Status: DC
  Administered 2016-02-07 – 2016-02-09 (×5): 40 mg via INTRAVENOUS
  Filled 2016-02-07 (×5): qty 40

## 2016-02-07 MED ORDER — FAMOTIDINE IN NACL 20-0.9 MG/50ML-% IV SOLN: 20.0000 mg | INTRAVENOUS | Status: DC

## 2016-02-07 MED ORDER — BUTAMBEN-TETRACAINE-BENZOCAINE 2-2-14 % EX AERO
1.0000 | INHALATION_SPRAY | Freq: Three times a day (TID) | CUTANEOUS | Status: DC | PRN
  Administered 2016-02-07: 1 via TOPICAL
  Filled 2016-02-07: qty 20

## 2016-02-07 NOTE — Progress Notes (Signed)
Shift assessment completed at 0730. Pt is awake, alert and oriented x4, in no distress. NG tube to L nare is intact and draining green fluid, attached to LIS. Pt denied nausea, has pain to her throat. Lungs are clear bilat, S1S2 auscultated. Abdomen si tender, few bs appreciated, pt stated she is passing occasional flatus. Xray has been completed. PPP, no edema noted. PIV #22 intact to L hand, IV ns infusing at 3675mls/hr, site si free of redness and swelling. Srx2, call bell in reach, visitor at bedside. Pt aksed for tube to be removed, this Clinical research associatewriter explained MD must check, if removed now, pt may return to N/V.

## 2016-02-07 NOTE — Progress Notes (Signed)
Dr. Judithann SheenSparks paged and responded. Notified him of pt's last finger stick and order for coverage, pt's npo status,and ng with drainage. Per Dr, hold coverage.

## 2016-02-07 NOTE — Progress Notes (Signed)
Pt has been bathed by staff. Ng output thus far this shift. Visitors at bedside, pt remains alert and oriented, no nausea, no vomiting, no stool yet this shift.

## 2016-02-07 NOTE — Plan of Care (Signed)
Problem: Bowel/Gastric: Goal: Will not experience complications related to bowel motility Outcome: Progressing Pt continues with NG tube, continues to drain. Progressing toward goals.

## 2016-02-07 NOTE — Progress Notes (Signed)
Pt has received cetacaine spray to her throat for continued pain. Ng draining green fluid that is thickening somewhat, total of drained thus far this shift. Pt has had a steady stream of visitors throughout the day.

## 2016-02-07 NOTE — Progress Notes (Signed)
[facility] - [place] at [facility]   PATIENT NAME: Morgan Howard    MR#:  [id number]  DATE OF BIRTH:  12/13/1914  SUBJECTIVE:   Pt is great mood today. Lot of visitors int he room NG+++ Throat pain due to NG Flatus+  REVIEW OF SYSTEMS:    Review of Systems  Constitutional: Positive for malaise/fatigue. Negative for chills and fever.  HENT: Negative for congestion and tinnitus.   Eyes: Negative for blurred vision and double vision.  Respiratory: Negative for cough, shortness of breath and wheezing.   Cardiovascular: Negative for chest pain, orthopnea and PND.  Gastrointestinal: Positive for nausea and vomiting. Negative for abdominal pain and diarrhea.  Genitourinary: Negative for dysuria and hematuria.  Neurological: Positive for weakness. Negative for dizziness, sensory change and focal weakness.  All other systems reviewed and are negative.   Nutrition: NPO Tolerating Diet: No Tolerating PT: Await Eval.    DRUG ALLERGIES:   Allergies  Allergen Reactions  . Biaxin [Clarithromycin]   . Lipitor [Atorvastatin]   . Lovastatin   . Nabumetone   . Penicillins     Has patient had a PCN reaction causing immediate rash, facial/tongue/throat swelling, SOB or lightheadedness with hypotension: No Has patient had a PCN reaction causing severe rash involving mucus membranes or skin necrosis: No Has patient had a PCN reaction that required hospitalization No Has patient had a PCN reaction occurring within the last 10 years: No If all of the above answers are "NO", then may proceed with Cephalosporin use.  Laurita Quint   . Zantac [Ranitidine Hcl]     VITALS:  Blood pressure (!) 159/63, pulse 95, temperature 97.5 F (36.4 C), temperature source Oral, resp. rate 16, height 4\' 11"  (1.499 m), weight 55.4 kg (122 lb 1.6 oz), SpO2 95 %.  PHYSICAL EXAMINATION:   Physical Exam  GENERAL:  82 y.o.-year-old patient lying in the bed in mild distress & restless in  bed. EYES: Pupils equal, round, reactive to light and accommodation. No scleral icterus. Extraocular muscles intact.  HEENT: Head atraumatic, normocephalic. Oropharynx and nasopharynx clear. NG+ NECK:  Supple, no jugular venous distention. No thyroid enlargement, no tenderness.  LUNGS: Normal breath sounds bilaterally, no wheezing, rales, rhonchi. No use of accessory muscles of respiration.  CARDIOVASCULAR: S1, S2 normal. No murmurs, rubs, or gallops.  ABDOMEN: Soft, nontender, Slightly distended. Bowel sounds Hypoactive. No organomegaly or mass.  EXTREMITIES: No cyanosis, clubbing or edema b/l.    NEUROLOGIC: Cranial nerves II through XII are intact. No focal Motor or sensory deficits b/l. Very Restless.  PSYCHIATRIC: The patient is alert and oriented x 3.  SKIN: No obvious rash, lesion, or ulcer.    LABORATORY PANEL:   CBC  Recent Labs Lab 02/07/16 0402  WBC 9.2  HGB 11.6*  HCT 34.2*  PLT 150   ------------------------------------------------------------------------------------------------------------------  Chemistries   Recent Labs Lab 02/04/16 2042  02/07/16 0402  NA 137  < > 145  K 3.9  < > 3.4*  CL 98*  < > 109  CO2 28  < > 24  GLUCOSE 105*  < > 122*  BUN 31*  < > 31*  CREATININE 1.59*  < > 1.79*  CALCIUM 8.9  < > 8.0*  MG 2.6*  --   --   AST 19  --   --   ALT 28  --   --   ALKPHOS 85  --   --   BILITOT 0.7  --   --   < > =  values in this interval not displayed. ------------------------------------------------------------------------------------------------------------------  Cardiac Enzymes  Recent Labs Lab 02/04/16 2042  TROPONINI <0.03   ------------------------------------------------------------------------------------------------------------------  RADIOLOGY:  Dg Abd 1 View  Result Date: 02/06/2016 CLINICAL DATA:  Nausea and heaviness emesis for the past week. History of cholecystectomy abdominal hysterectomy, and appendectomy. EXAM: ABDOMEN -  1 VIEW COMPARISON:  Abdominal and pelvic CT scan dated February 04, 2016 FINDINGS: The stomach remains distended with fluid and gas. There is air and fluid in the duodenal bulb region. There is no significance obstructive pattern observed elsewhere. There is contrast in the right colon. No definite free extraluminal gas. The lung bases are clear. IMPRESSION: Persistent gastric distention. Probable mild duodenal distention. Nasogastric suction may be useful. Electronically Signed   By: David  SwazilandJordan M.D.   On: 02/06/2016 11:08   Dg Abd Portable 1v  Result Date: 02/07/2016 CLINICAL DATA:  Inpatient with nausea and vomiting x 5 days, possible ileus, NG tube placed yesterday per family EXAM: PORTABLE ABDOMEN - 1 VIEW COMPARISON:  02/06/2016 FINDINGS: Since prior exam, a nasal/ orogastric tube has been placed. Tip projects in the distal stomach. Stomach is partly decompressed when compared to the previous day's study. There is no bowel dilation. Some residual contrast is noted in the right colon. IMPRESSION: 1. Well-positioned nasal/orogastric tube. 2. No evidence of bowel obstruction or acute abnormality. Electronically Signed   By: Amie Portlandavid  Ormond M.D.   On: 02/07/2016 09:30     ASSESSMENT AND PLAN:   82 year old female with past medical history of hypertension, hyperlipidemia, diabetes, history of psoriasis who presented to the hospital due to intractable nausea and vomiting.  1. Intractable nausea and vomiting-etiology unclear. Suspected to be secondary to a viral enteritis/Ileus -Patient continues to have intractable nausea and vomiting without much improvement. Abdominal x-ray this morning showing persistent gastric and duodenal distention. NG tube has been placed.. -Continue supportive care with IV fluids, antiemetics.  Nothing by mouth today. -Surgical consult noted-recommends cont present care  2. DM - cont. SSI. No further Hypoglycemia.  - BS stable.   3. HTN - BP a bit high today but pt.  Cannot take Oral meds.  - PRN Hydralazine for now.    4. GERD - cont. Protonix.   5. Restless Leg syndrome - cont. Requip.  -  PRN morphine ordered which as per nursing has helped.    All the records are reviewed and case discussed with Care Management/Social Worker. Management plans discussed with the patient, family and they are in agreement.  CODE STATUS: Full Code  DVT Prophylaxis: Heparin SQ  TOTAL TIME TAKING CARE OF THIS PATIENT: 30 minutes.   POSSIBLE D/C IN 1-2 DAYS, DEPENDING ON CLINICAL CONDITION.   Jesselee Poth M.D on 02/07/2016 at 1:28 PM  Between 7am to 6pm - Pager - [phone]  After 6pm go to [email or web address] - Scientist, research (life sciences)password [email or web address] [email or web address]  [facility] [place] [facility]  Office  [phone]  CC: Primary care physician; Barbette ReichmannHANDE,VISHWANATH, MD

## 2016-02-08 LAB — GLUCOSE, CAPILLARY
GLUCOSE-CAPILLARY: 131 mg/dL — AB (ref 65–99)
GLUCOSE-CAPILLARY: 100 mg/dL — AB (ref 65–99)
Glucose-Capillary: 194 mg/dL — ABNORMAL HIGH (ref 65–99)
Glucose-Capillary: 175 mg/dL — ABNORMAL HIGH (ref 65–99)

## 2016-02-08 MED ORDER — METOPROLOL TARTRATE 5 MG/5ML IV SOLN
5.0000 mg | Freq: Four times a day (QID) | INTRAVENOUS | Status: DC
  Administered 2016-02-08 – 2016-02-11 (×12): 5 mg via INTRAVENOUS
  Filled 2016-02-08 (×12): qty 5

## 2016-02-08 MED ORDER — BISACODYL 10 MG RE SUPP
10.0000 mg | Freq: Two times a day (BID) | RECTAL | Status: DC
  Administered 2016-02-08 – 2016-02-11 (×7): 10 mg via RECTAL
  Filled 2016-02-08 (×7): qty 1

## 2016-02-08 NOTE — Progress Notes (Signed)
[facility] - [place] at [facility]   PATIENT NAME: Morgan Howard    MR#:  [id number]  DATE OF BIRTH:  [birth year]  SUBJECTIVE:   Out in the chair NG+++ Throat pain due to NG Flatus+ NG output 500 cc overnite  REVIEW OF SYSTEMS:    Review of Systems  Constitutional: Positive for malaise/fatigue. Negative for chills and fever.  HENT: Negative for congestion and tinnitus.   Eyes: Negative for blurred vision and double vision.  Respiratory: Negative for cough, shortness of breath and wheezing.   Cardiovascular: Negative for chest pain, orthopnea and PND.  Gastrointestinal: Positive for nausea and vomiting. Negative for abdominal pain and diarrhea.  Genitourinary: Negative for dysuria and hematuria.  Neurological: Positive for weakness. Negative for dizziness, sensory change and focal weakness.  All other systems reviewed and are negative.   Nutrition: NPO Tolerating Diet: No Tolerating PT: HHPT   DRUG ALLERGIES:   Allergies  Allergen Reactions  . Biaxin [Clarithromycin]   . Lipitor [Atorvastatin]   . Lovastatin   . Nabumetone   . Penicillins     Has patient had a PCN reaction causing immediate rash, facial/tongue/throat swelling, SOB or lightheadedness with hypotension: No Has patient had a PCN reaction causing severe rash involving mucus membranes or skin necrosis: No Has patient had a PCN reaction that required hospitalization No Has patient had a PCN reaction occurring within the last 10 years: No If all of the above answers are "NO", then may proceed with Cephalosporin use.  Laurita Quint   . Zantac [Ranitidine Hcl]     VITALS:  Blood pressure (!) 179/65, pulse 76, temperature 97.4 F (36.3 C), temperature source Oral, resp. rate 18, height 4\' 11"  (1.499 m), weight 55.4 kg (122 lb 1.6 oz), SpO2 99 %.  PHYSICAL EXAMINATION:   Physical Exam  GENERAL:  82 y.o.-year-old patient lying in the bed in mild distress & restless in bed. EYES: Pupils  equal, round, reactive to light and accommodation. No scleral icterus. Extraocular muscles intact.  HEENT: Head atraumatic, normocephalic. Oropharynx and nasopharynx clear. NG+ NECK:  Supple, no jugular venous distention. No thyroid enlargement, no tenderness.  LUNGS: Normal breath sounds bilaterally, no wheezing, rales, rhonchi. No use of accessory muscles of respiration.  CARDIOVASCULAR: S1, S2 normal. No murmurs, rubs, or gallops.  ABDOMEN: Soft, nontender, Slightly distended. Bowel sounds Hypoactive. No organomegaly or mass.  EXTREMITIES: No cyanosis, clubbing or edema b/l.    NEUROLOGIC: Cranial nerves II through XII are intact. No focal Motor or sensory deficits b/l. Very Restless.  PSYCHIATRIC: The patient is alert and oriented x 3.  SKIN: No obvious rash, lesion, or ulcer.    LABORATORY PANEL:   CBC  Recent Labs Lab 02/07/16 0402  WBC 9.2  HGB 11.6*  HCT 34.2*  PLT 150   ------------------------------------------------------------------------------------------------------------------  Chemistries   Recent Labs Lab 02/04/16 2042  02/07/16 0402  NA 137  < > 145  K 3.9  < > 3.4*  CL 98*  < > 109  CO2 28  < > 24  GLUCOSE 105*  < > 122*  BUN 31*  < > 31*  CREATININE 1.59*  < > 1.79*  CALCIUM 8.9  < > 8.0*  MG 2.6*  --   --   AST 19  --   --   ALT 28  --   --   ALKPHOS 85  --   --   BILITOT 0.7  --   --   < > =  values in this interval not displayed. ------------------------------------------------------------------------------------------------------------------  Cardiac Enzymes  Recent Labs Lab 02/04/16 2042  TROPONINI <0.03   ------------------------------------------------------------------------------------------------------------------  RADIOLOGY:  Dg Abd Portable 1v  Result Date: 02/07/2016 CLINICAL DATA:  Inpatient with nausea and vomiting x 5 days, possible ileus, NG tube placed yesterday per family EXAM: PORTABLE ABDOMEN - 1 VIEW COMPARISON:   02/06/2016 FINDINGS: Since prior exam, a nasal/ orogastric tube has been placed. Tip projects in the distal stomach. Stomach is partly decompressed when compared to the previous day's study. There is no bowel dilation. Some residual contrast is noted in the right colon. IMPRESSION: 1. Well-positioned nasal/orogastric tube. 2. No evidence of bowel obstruction or acute abnormality. Electronically Signed   By: Amie Portlandavid  Ormond M.D.   On: 02/07/2016 09:30     ASSESSMENT AND PLAN:   82 year old female with past medical history of hypertension, hyperlipidemia, diabetes, history of psoriasis who presented to the hospital due to intractable nausea and vomiting.  1. Intractable nausea and vomiting-etiology unclear. Suspected to be secondary to a /Ileus...surgery thinks ?? Diabetic gatroparesis - tube has been placed.. -Continue supportive care with IV fluids, antiemetics.  Nothing by mouth  -Surgical consult noted-recommends cont present care -dulcolax bid to stimulate bowels to gain peristalsis  2. DM - cont. SSI. No further Hypoglycemia.  - BS stable.   3. HTN - BP a bit high today but pt. Cannot take Oral meds.  - PRN Hydralazine for now.    4. GERD - cont. Protonix.   5. Restless Leg syndrome - cont. Requip.  -  PRN morphine ordered which as per nursing has helped.   6.PT recommends HHPT All the records are reviewed and case discussed with Care Management/Social Worker. Management plans discussed with the patient, family and they are in agreement.  CODE STATUS: Full Code  DVT Prophylaxis: Heparin SQ  TOTAL TIME TAKING CARE OF THIS PATIENT: 30 minutes.   POSSIBLE D/C IN 1-2 DAYS, DEPENDING ON CLINICAL CONDITION.   Umaiza Matusik M.D on 02/08/2016 at 12:30 PM  Between 7am to 6pm - Pager - [phone]  After 6pm go to [email or web address] - Scientist, research (life sciences)password EPAS [email or web address]  [facility] [place] [facility]  Office  [phone]  CC: Primary care physician; Barbette ReichmannHANDE,VISHWANATH, MD

## 2016-02-08 NOTE — Evaluation (Signed)
Physical Therapy Evaluation Patient Details Name: Morgan Howard MRN: [id number] DOB: November 13, 1914 Today's Date: 02/08/2016   History of Present Illness  82 y/o female who was having nausea/vomiting and restless legs.  She was admitted with dehydration.  Medical Hx includes HTN, DM, HLD, and psoriasis.  Pt now with NG tube, N/V cleared.   Clinical Impression  Pt shows good effort t/o PT exam and generally did well.  She is weaker and slower than her baseline but will be safe to go home with 24 hour assist (which is available).  Pt needed only light assist to get to EOB and was able to stand and ambulation w/o direct assist. Pt has some fatigue with the effort of walking, but her vitals remained stable and overall she showed good safety.     Follow Up Recommendations Home health PT    Equipment Recommendations       Recommendations for Other Services       Precautions / Restrictions Precautions Precautions: Fall Restrictions Weight Bearing Restrictions: No      Mobility  Bed Mobility Overal bed mobility: Needs Assistance Bed Mobility: Supine to Sit     Supine to sit: Min assist     General bed mobility comments: Pt needs only light assist to get to fully upright, she ultiamtely   Transfers Overall transfer level: Modified independent Equipment used: Rolling walker (2 wheeled) Transfers: Sit to/from Stand Sit to Stand: Min guard         General transfer comment: Pt did well with getting to standing and needed only light cuing and no direct assist  Ambulation/Gait Ambulation/Gait assistance: Supervision Ambulation Distance (Feet): 60 Feet Assistive device: Rolling walker (2 wheeled)       General Gait Details: Pt was able to ambulate with slow, but consistent cadence, no LOBs and though she had some fatigue her O2 remained in the high 90s and her HR stayed 60-70  Stairs            Wheelchair Mobility    Modified Rankin (Stroke Patients Only)        Balance Overall balance assessment: Modified Independent (pt able to maintain balance with ADs)                                           Pertinent Vitals/Pain Pain Assessment:  (reports only throat soreness from NG tube)    Home Living Family/patient expects to be discharged to:: Private residence Living Arrangements: Alone Available Help at Discharge: Personal care attendant;Available 24 hours/day;Family Type of Home: House Home Access: Ramped entrance     Home Layout: Able to live on main level with bedroom/bathroom Home Equipment: Bedside commode;Walker - 4 wheels;Walker - 2 wheels Additional Comments: Pt has attendent present from 0900 to 2000 and has access to overnight care if needed    Prior Function Level of Independence: Independent with assistive device(s)         Comments: Pt uses RW for household distances     Hand Dominance        Extremity/Trunk Assessment   Upper Extremity Assessment: Overall WFL for tasks assessed (age appropriate deficits, R shoulder elev: ~120, L ~90)           Lower Extremity Assessment: Overall WFL for tasks assessed (age appropriate deficits)         Communication   Communication: No difficulties  Cognition  Arousal/Alertness: Awake/alert Behavior During Therapy: WFL for tasks assessed/performed Overall Cognitive Status: Within Functional Limits for tasks assessed                      General Comments      Exercises     Assessment/Plan    PT Assessment Patient needs continued PT services  PT Problem List Decreased strength;Decreased range of motion;Decreased activity tolerance;Decreased balance;Decreased mobility;Decreased safety awareness          PT Treatment Interventions DME instruction;Gait training;Functional mobility training;Therapeutic activities;Therapeutic exercise;Balance training;Neuromuscular re-education;Patient/family education    PT Goals (Current goals can be  found in the Care Plan section)  Acute Rehab PT Goals Patient Stated Goal: go home PT Goal Formulation: With patient Time For Goal Achievement: 02/22/16 Potential to Achieve Goals: Good    Frequency Min 2X/week   Barriers to discharge        Co-evaluation               End of Session Equipment Utilized During Treatment: Gait belt Activity Tolerance: Patient limited by fatigue;Patient tolerated treatment well Patient left: with chair alarm set;with call bell/phone within reach;with family/visitor present           Time: [id number] PT Time Calculation (min) (ACUTE ONLY): 24 min   Charges:   PT Evaluation $PT Eval Low Complexity: 1 Procedure     PT G CodesMalachi Pro:        Jensyn Shave R Kayelee Herbig, DPT 02/08/2016, 12:08 PM

## 2016-02-08 NOTE — Progress Notes (Addendum)
Patient is A&O x4. Up with assist x1 with [employer]. Tolerated up to chair for part of this shift. NG pulling darking drainage. IV fluids increased this shift. Still NPO. High BP's, new order for IV BP med. Passing some flatus. Incont of urine at times. Blood sugars 194, 175, and 131, received SS insulin coverage as ordered. Family and/or caregiver in room all day. NG output was 50cc. BM this shift.

## 2016-02-09 LAB — GLUCOSE, CAPILLARY
GLUCOSE-CAPILLARY: 185 mg/dL — AB (ref 65–99)
GLUCOSE-CAPILLARY: 157 mg/dL — AB (ref 65–99)
Glucose-Capillary: 103 mg/dL — ABNORMAL HIGH (ref 65–99)
Glucose-Capillary: 130 mg/dL — ABNORMAL HIGH (ref 65–99)

## 2016-02-09 LAB — BASIC METABOLIC PANEL
ANION GAP: 11 (ref 5–15)
BUN: 25 mg/dL — ABNORMAL HIGH (ref 6–20)
CALCIUM: 8 mg/dL — AB (ref 8.9–10.3)
CO2: 21 mmol/L — ABNORMAL LOW (ref 22–32)
CREATININE: 1.4 mg/dL — AB (ref 0.44–1.00)
Chloride: 113 mmol/L — ABNORMAL HIGH (ref 101–111)
GFR calc Af Amer: 35 mL/min — ABNORMAL LOW (ref >60)
GFR, EST NON AFRICAN AMERICAN: 30 mL/min — AB (ref >60)
GLUCOSE: 120 mg/dL — AB (ref 65–99)
Potassium: 2.8 mmol/L — ABNORMAL LOW (ref 3.5–5.1)
Sodium: 145 mmol/L (ref 135–145)

## 2016-02-09 MED ORDER — NITROGLYCERIN 2 % TD OINT
0.5000 [in_us] | TOPICAL_OINTMENT | Freq: Four times a day (QID) | TRANSDERMAL | Status: DC
  Administered 2016-02-09: 0.5 [in_us] via TOPICAL
  Filled 2016-02-09: qty 30
  Filled 2016-02-09: qty 0.5

## 2016-02-09 MED ORDER — ROPINIROLE HCL 0.25 MG PO TABS
0.5000 mg | ORAL_TABLET | Freq: Two times a day (BID) | ORAL | Status: DC
  Administered 2016-02-09 – 2016-02-11 (×4): 0.5 mg via ORAL
  Filled 2016-02-09 (×5): qty 2

## 2016-02-09 MED ORDER — HYDRALAZINE HCL 20 MG/ML IJ SOLN
10.0000 mg | Freq: Four times a day (QID) | INTRAMUSCULAR | Status: DC
  Administered 2016-02-09 – 2016-02-11 (×7): 10 mg via INTRAVENOUS
  Filled 2016-02-09 (×8): qty 1

## 2016-02-09 MED ORDER — PANTOPRAZOLE SODIUM 40 MG IV SOLR: 40.0000 mg | INTRAVENOUS | Status: DC

## 2016-02-09 MED ORDER — POTASSIUM CHLORIDE 10 MEQ/100ML IV SOLN
10.0000 meq | INTRAVENOUS | Status: AC
  Administered 2016-02-09 (×4): 10 meq via INTRAVENOUS
  Filled 2016-02-09 (×4): qty 100

## 2016-02-09 MED ORDER — POTASSIUM CHLORIDE IN NACL 40-0.9 MEQ/L-% IV SOLN
INTRAVENOUS | Status: DC
  Administered 2016-02-09: 100 mL/h via INTRAVENOUS
  Administered 2016-02-10 – 2016-02-11 (×2): 50 mL/h via INTRAVENOUS
  Filled 2016-02-09 (×6): qty 1000

## 2016-02-09 MED ORDER — NITROGLYCERIN 2 % TD OINT
1.0000 [in_us] | TOPICAL_OINTMENT | Freq: Four times a day (QID) | TRANSDERMAL | Status: DC
  Administered 2016-02-09 – 2016-02-11 (×5): 1 [in_us] via TOPICAL
  Filled 2016-02-09 (×2): qty 1
  Filled 2016-02-09: qty 30
  Filled 2016-02-09: qty 1

## 2016-02-09 NOTE — Progress Notes (Signed)
MEDICATION RELATED NOTE   Potassium replacement Indication: Hypokalemia  Allergies  Allergen Reactions  . Biaxin [Clarithromycin]   . Lipitor [Atorvastatin]   . Lovastatin   . Nabumetone   . Penicillins     Has patient had a PCN reaction causing immediate rash, facial/tongue/throat swelling, SOB or lightheadedness with hypotension: No Has patient had a PCN reaction causing severe rash involving mucus membranes or skin necrosis: No Has patient had a PCN reaction that required hospitalization No Has patient had a PCN reaction occurring within the last 10 years: No If all of the above answers are "NO", then may proceed with Cephalosporin use.  Laurita Quint. Psoralea   . Zantac [Ranitidine Hcl]     Patient Measurements: Height: 4\' 11"  (149.9 cm) Weight: 122 lb 1.6 oz (55.4 kg) IBW/kg (Calculated) : 43.2  Vital Signs: Temp: 98.2 F (36.8 C) (09/18 0800) Temp Source: Oral (09/18 0800) BP: 176/93 (09/18 1516) Pulse Rate: 78 (09/18 1354) Intake/Output from previous day: 09/17 0701 - 09/18 0700 In: 2283.8 [I.V.:2283.8] Out: 250 [Emesis/NG output:250] Intake/Output from this shift: No intake/output data recorded.  Labs:  Recent Labs  02/07/16 0402 02/09/16 1524  WBC 9.2  --   HGB 11.6*  --   HCT 34.2*  --   PLT 150  --   CREATININE 1.79* 1.40*   Estimated Creatinine Clearance: 16.6 mL/min (by C-G formula based on SCr of 1.4 mg/dL (H)).   Microbiology: No results found for this or any previous visit (from the past 720 hour(s)).  Medications:  Scheduled:  . bisacodyl  10 mg Rectal BID  . heparin subcutaneous  5,000 Units Subcutaneous Q8H  . hydrALAZINE  10 mg Intravenous Q6H  . insulin aspart  0-5 Units Subcutaneous QHS  . insulin aspart  0-9 Units Subcutaneous TID WC  . metoprolol  5 mg Intravenous Q6H  . nitroGLYCERIN  1 inch Topical Q6H  . ondansetron (ZOFRAN) IV  8 mg Intravenous Once  . potassium chloride  10 mEq Oral Daily  . potassium chloride  10 mEq Intravenous Q1  Hr x 4  . rOPINIRole  0.5 mg Oral BID    Assessment: 82 yo female with intractable N/V.   K =  2.8. MD requested Potassium replacement.  Goal of Therapy:  K  3.5-5.1  Plan:  Per MD request, ordered NS w/KCl 40meq per liter to run at 100 ml/hr. Also ordered KCl 40meq IV bolus.  Will recheck Potassium level again with am labs.  Stormy CardKatsoudas,Jazari Ober K, RPh Clinical Pharmacist 02/09/2016,5:08 PM

## 2016-02-09 NOTE — Progress Notes (Signed)
Physical Therapy Treatment Patient Details Name: Morgan Howard MRN: [id number] DOB: 01/29/1915 Today's Date: 02/09/2016    History of Present Illness 82 y/o female who was having nausea/vomiting and restless legs.  She was admitted with dehydration.  Medical Hx includes HTN, DM, HLD, and psoriasis.  Pt now with NG tube, N/V cleared.     PT Comments    Pt agreeable to PT. Pt has no voiced complaints other than a sore throat. Pt participates well in seated and long sit exercises without physical assist required. Pt demonstrates fair stability in stand and with ambulation with use of rolling walker requiring Min guard at most; ambulation slow, steady and near baseline for pt. Pt receives up in chair comfortably. Continue PT to progress strength and endurance to improve functional mobility to allow for an optimal, safe return home at prior level of function.   Follow Up Recommendations  Home health PT;Supervision/Assistance - 24 hour     Equipment Recommendations       Recommendations for Other Services       Precautions / Restrictions Precautions Precautions: Fall Restrictions Weight Bearing Restrictions: No    Mobility  Bed Mobility Overal bed mobility: Needs Assistance Bed Mobility: Supine to Sit     Supine to sit: Min assist     General bed mobility comments: Min A for trunk elevation; performs mostly on her own  Transfers Overall transfer level: Needs assistance Equipment used: Rolling walker (2 wheeled) Transfers: Sit to/from Stand Sit to Stand: Min guard         General transfer comment: cues for hand placement and Min guard to ensure steadiness; no LOB noted  Ambulation/Gait Ambulation/Gait assistance: Min guard Ambulation Distance (Feet): 65 Feet Assistive device: Rolling walker (2 wheeled) Gait Pattern/deviations: WFL(Within Functional Limits)   Gait velocity interpretation: at or above normal speed for age/gender General Gait Details: Mildly under  baseline according to care taker. Overall, pt ambulates well without LOB   Stairs            Wheelchair Mobility    Modified Rankin (Stroke Patients Only)       Balance Overall balance assessment: Needs assistance Sitting-balance support: Feet supported Sitting balance-Leahy Scale: Good     Standing balance support: Bilateral upper extremity supported Standing balance-Leahy Scale: Fair                      Cognition Arousal/Alertness: Awake/alert Behavior During Therapy: WFL for tasks assessed/performed Overall Cognitive Status: Within Functional Limits for tasks assessed                      Exercises General Exercises - Lower Extremity Quad Sets: Strengthening;Both;10 reps Gluteal Sets: Strengthening;Both;10 reps Long Arc Quad: AROM;Both;10 reps;Seated Hip ABduction/ADduction: AROM;Both;10 reps;Seated Hip Flexion/Marching: AROM;Both;20 reps;Seated Toe Raises: AROM;Both;20 reps;Seated Heel Raises: AROM;Both;20 reps;Seated    General Comments        Pertinent Vitals/Pain Pain Assessment:  (Throat is sore)    Home Living                      Prior Function            PT Goals (current goals can now be found in the care plan section) Progress towards PT goals: Progressing toward goals    Frequency    Min 2X/week      PT Plan Current plan remains appropriate    Co-evaluation  End of Session Equipment Utilized During Treatment: Gait belt Activity Tolerance: Patient tolerated treatment well Patient left: with chair alarm set;with call bell/phone within reach;with family/visitor present     Time: 1354-1430 PT Time Calculation (min) (ACUTE ONLY): 36 min  Charges:  $Gait Training: 8-22 mins $Therapeutic Exercise: 8-22 mins                    G Codes:      Scot Dock, PTA 02/09/2016, 2:50 PM

## 2016-02-09 NOTE — Progress Notes (Signed)
[facility] - [place] at [facility]   PATIENT NAME: Morgan Howard    MR#:  [id number]  DATE OF BIRTH:  17-Apr-1915  SUBJECTIVE:   Out in the chair NG output over 24 hours is 250cc Had 3 small BM's Throat pain due to NG Flatus+  REVIEW OF SYSTEMS:    Review of Systems  Constitutional: Positive for malaise/fatigue. Negative for chills and fever.  HENT: Negative for congestion and tinnitus.   Eyes: Negative for blurred vision and double vision.  Respiratory: Negative for cough, shortness of breath and wheezing.   Cardiovascular: Negative for chest pain, orthopnea and PND.  Gastrointestinal: Negative for abdominal pain and diarrhea.  Genitourinary: Negative for dysuria and hematuria.  Neurological: Positive for weakness. Negative for dizziness, sensory change and focal weakness.  All other systems reviewed and are negative.   Nutrition: NPO Tolerating Diet: No Tolerating PT: HHPT   DRUG ALLERGIES:   Allergies  Allergen Reactions  . Biaxin [Clarithromycin]   . Lipitor [Atorvastatin]   . Lovastatin   . Nabumetone   . Penicillins     Has patient had a PCN reaction causing immediate rash, facial/tongue/throat swelling, SOB or lightheadedness with hypotension: No Has patient had a PCN reaction causing severe rash involving mucus membranes or skin necrosis: No Has patient had a PCN reaction that required hospitalization No Has patient had a PCN reaction occurring within the last 10 years: No If all of the above answers are "NO", then may proceed with Cephalosporin use.  Laurita Quint   . Zantac [Ranitidine Hcl]     VITALS:  Blood pressure (!) 176/93, pulse 78, temperature 98.2 F (36.8 C), temperature source Oral, resp. rate 18, height 4\' 11"  (1.499 m), weight 55.4 kg (122 lb 1.6 oz), SpO2 98 %.  PHYSICAL EXAMINATION:   Physical Exam  GENERAL:  82 y.o.-year-old patient lying in the bed in mild distress & restless in bed. EYES: Pupils equal, round,  reactive to light and accommodation. No scleral icterus. Extraocular muscles intact.  HEENT: Head atraumatic, normocephalic. Oropharynx and nasopharynx clear. NG+ NECK:  Supple, no jugular venous distention. No thyroid enlargement, no tenderness.  LUNGS: Normal breath sounds bilaterally, no wheezing, rales, rhonchi. No use of accessory muscles of respiration.  CARDIOVASCULAR: S1, S2 normal. No murmurs, rubs, or gallops.  ABDOMEN: Soft, nontender, Slightly distended. Bowel sounds Hypoactive. No organomegaly or mass.  EXTREMITIES: No cyanosis, clubbing or edema b/l.    NEUROLOGIC: Cranial nerves II through XII are intact. No focal Motor or sensory deficits b/l. Very Restless.  PSYCHIATRIC: The patient is alert and oriented x 3.  SKIN: No obvious rash, lesion, or ulcer.    LABORATORY PANEL:   CBC  Recent Labs Lab 02/07/16 0402  WBC 9.2  HGB 11.6*  HCT 34.2*  PLT 150   ------------------------------------------------------------------------------------------------------------------  Chemistries   Recent Labs Lab 02/04/16 2042  02/09/16 1524  NA 137  < > 145  K 3.9  < > 2.8*  CL 98*  < > 113*  CO2 28  < > 21*  GLUCOSE 105*  < > 120*  BUN 31*  < > 25*  CREATININE 1.59*  < > 1.40*  CALCIUM 8.9  < > 8.0*  MG 2.6*  --   --   AST 19  --   --   ALT 28  --   --   ALKPHOS 85  --   --   BILITOT 0.7  --   --   < > =  values in this interval not displayed. ------------------------------------------------------------------------------------------------------------------  Cardiac Enzymes  Recent Labs Lab 02/04/16 2042  TROPONINI <0.03   ------------------------------------------------------------------------------------------------------------------  RADIOLOGY:  No results found.   ASSESSMENT AND PLAN:   82 year old female with past medical history of hypertension, hyperlipidemia, diabetes, history of psoriasis who presented to the hospital due to intractable nausea and  vomiting.  1. Intractable nausea and vomiting-etiology unclear. Suspected to be secondary to a /Ileus...surgery thinks ?? Diabetic gastroparesis (not seen a note from surgery since 02/06/16) - tube has been placed.. -Continue supportive care with IV fluids, antiemetics.  Nothing by mouth  -Surgical consult noted-recommends cont present care -dulcolax bid to stimulate bowels to gain peristalsis -pt had 3 small BM's and decreasing NG output  2. DM - cont. SSI. No further Hypoglycemia.  - BS stable.   3. HTN - BP a bit high today but pt. Cannot take Oral meds.  -IV BB,nitropaste and IV hydralazine  4. GERD - cont. Protonix.   5. Restless Leg syndrome - cont. Requip.  -  PRN morphine ordered which as per nursing has helped.   6.PT recommends HHPT  Spoke with caregiver Marcelino DusterMichelle in the room All the records are reviewed and case discussed with Care Management/Social Worker. Management plans discussed with the patient, family and they are in agreement.  CODE STATUS: Full Code  DVT Prophylaxis: Heparin SQ  TOTAL TIME TAKING CARE OF THIS PATIENT: 30 minutes.   POSSIBLE D/C IN 1-2 DAYS, DEPENDING ON CLINICAL CONDITION.   Berniece Abid M.D on 02/09/2016 at 5:00 PM  Between 7am to 6pm - Pager - [phone]  After 6pm go to [email or web address] - Scientist, research (life sciences)password [email or web address]  [facility] [place] [facility]  Office  [phone]  CC: Primary care physician; Barbette ReichmannHANDE,VISHWANATH, MD

## 2016-02-09 NOTE — Progress Notes (Signed)
Patient's BP was 182/67, MD on floor at this time, requesting nursing to give IV metoprolol early (due at 1200), instead of the PRN hydralazin.

## 2016-02-10 ENCOUNTER — Inpatient Hospital Stay: Payer: Medicare Other

## 2016-02-10 LAB — GLUCOSE, CAPILLARY
GLUCOSE-CAPILLARY: 169 mg/dL — AB (ref 65–99)
Glucose-Capillary: 211 mg/dL — ABNORMAL HIGH (ref 65–99)
Glucose-Capillary: 152 mg/dL — ABNORMAL HIGH (ref 65–99)
Glucose-Capillary: 162 mg/dL — ABNORMAL HIGH (ref 65–99)

## 2016-02-10 LAB — POTASSIUM: Potassium: 4 mmol/L (ref 3.5–5.1)

## 2016-02-10 MED ORDER — MAGIC MOUTHWASH
5.0000 mL | Freq: Three times a day (TID) | ORAL | Status: DC | PRN
  Filled 2016-02-10: qty 5

## 2016-02-10 NOTE — Progress Notes (Signed)
Patient complaining of secretions from throat. MD notified. Swish and spit ordered.

## 2016-02-10 NOTE — Progress Notes (Signed)
[facility] - [place] at [facility]   PATIENT NAME: Morgan Howard    MR#:  [id number]  DATE OF BIRTH:  [birth year]  SUBJECTIVE:   Out in the chair NG output over 24 hours is 200cc Had 3 small BM's Throat pain due to NG Doing well  REVIEW OF SYSTEMS:    Review of Systems  Constitutional: Positive for malaise/fatigue. Negative for chills and fever.  HENT: Negative for congestion and tinnitus.   Eyes: Negative for blurred vision and double vision.  Respiratory: Negative for cough, shortness of breath and wheezing.   Cardiovascular: Negative for chest pain, orthopnea and PND.  Gastrointestinal: Negative for abdominal pain and diarrhea.  Genitourinary: Negative for dysuria and hematuria.  Neurological: Positive for weakness. Negative for dizziness, sensory change and focal weakness.  All other systems reviewed and are negative.   Nutrition: NPO Tolerating Diet: No Tolerating PT: HHPT   DRUG ALLERGIES:   Allergies  Allergen Reactions  . Biaxin [Clarithromycin]   . Lipitor [Atorvastatin]   . Lovastatin   . Nabumetone   . Penicillins     Has patient had a PCN reaction causing immediate rash, facial/tongue/throat swelling, SOB or lightheadedness with hypotension: No Has patient had a PCN reaction causing severe rash involving mucus membranes or skin necrosis: No Has patient had a PCN reaction that required hospitalization No Has patient had a PCN reaction occurring within the last 10 years: No If all of the above answers are "NO", then may proceed with Cephalosporin use.  Laurita Quint. Psoralea   . Zantac [Ranitidine Hcl]     VITALS:  Blood pressure (!) 151/80, pulse 91, temperature 97.9 F (36.6 C), temperature source Oral, resp. rate 16, height 4\' 11"  (1.499 m), weight 55.4 kg (122 lb 1.6 oz), SpO2 98 %.  PHYSICAL EXAMINATION:   Physical Exam  GENERAL:  82 y.o.-year-old patient lying in the bed in mild distress & restless in bed. EYES: Pupils equal, round,  reactive to light and accommodation. No scleral icterus. Extraocular muscles intact.  HEENT: Head atraumatic, normocephalic. Oropharynx and nasopharynx clear. NECK:  Supple, no jugular venous distention. No thyroid enlargement, no tenderness.  LUNGS: Normal breath sounds bilaterally, no wheezing, rales, rhonchi. No use of accessory muscles of respiration.  CARDIOVASCULAR: S1, S2 normal. No murmurs, rubs, or gallops.  ABDOMEN: Soft, nontender, Slightly distended. Bowel sounds Hypoactive. No organomegaly or mass.  EXTREMITIES: No cyanosis, clubbing or edema b/l.    NEUROLOGIC: Cranial nerves II through XII are intact. No focal Motor or sensory deficits b/l. Very Restless.  PSYCHIATRIC: The patient is alert and oriented x 3.  SKIN: No obvious rash, lesion, or ulcer.    LABORATORY PANEL:   CBC  Recent Labs Lab 02/07/16 0402  WBC 9.2  HGB 11.6*  HCT 34.2*  PLT 150   ------------------------------------------------------------------------------------------------------------------  Chemistries   Recent Labs Lab 02/04/16 2042  02/09/16 1524 02/10/16 0525  NA 137  < > 145  --   K 3.9  < > 2.8* 4.0  CL 98*  < > 113*  --   CO2 28  < > 21*  --   GLUCOSE 105*  < > 120*  --   BUN 31*  < > 25*  --   CREATININE 1.59*  < > 1.40*  --   CALCIUM 8.9  < > 8.0*  --   MG 2.6*  --   --   --   AST 19  --   --   --  ALT 28  --   --   --   ALKPHOS 85  --   --   --   BILITOT 0.7  --   --   --   < > = values in this interval not displayed. ------------------------------------------------------------------------------------------------------------------  Cardiac Enzymes  Recent Labs Lab 02/04/16 2042  TROPONINI <0.03   ------------------------------------------------------------------------------------------------------------------  RADIOLOGY:  Dg Abd 1 View  Result Date: 02/10/2016 CLINICAL DATA:  Nausea and vomiting and dehydration EXAM: ABDOMEN - 1 VIEW COMPARISON:  02/07/2016  FINDINGS: Nasogastric catheter is again noted in the distal stomach. Contrast material is noted scattered throughout the colon stable from the prior exam. No obstructive changes are seen. No free air is noted. Bony structures show degenerative change of the lumbar spine. IMPRESSION: No acute abnormality noted. Nasogastric catheter within the stomach. Electronically Signed   By: Alcide Clever M.D.   On: 02/10/2016 09:51     ASSESSMENT AND PLAN:   82 year old female with past medical history of hypertension, hyperlipidemia, diabetes, history of psoriasis who presented to the hospital due to intractable nausea and vomiting.  1. Intractable nausea and vomiting-etiology unclear. Suspected to be secondary to a /Ileus - NG tube has been removed -Continue supportive care with IV fluids, antiemetics.  -CLD -dulcolax bid to stimulate bowels to gain peristalsis -pt had 3 small BM's   2. DM - cont. SSI. No further Hypoglycemia.  - BS stable.   3. HTN - BP a bit high today but pt. Cannot take Oral meds.  -IV BB,nitropaste and IV hydralazine  4. GERD - cont. Protonix.   5. Restless Leg syndrome - cont. Requip.  -  PRN morphine ordered which as per nursing has helped.   6.PT recommends HHPT  Spoke with caregiver Marcelino Duster in the room  If continues to improve d/c in am All the records are reviewed and case discussed with Care Management/Social Worker. Management plans discussed with the patient, family and they are in agreement.  CODE STATUS: Full Code  DVT Prophylaxis: Heparin SQ  TOTAL TIME TAKING CARE OF THIS PATIENT: 30 minutes.   POSSIBLE D/C IN 1-2 DAYS, DEPENDING ON CLINICAL CONDITION.   Zahirah Cheslock M.D on 02/10/2016 at 1:18 PM  Between 7am to 6pm - Pager - [phone]  After 6pm go to [email or web address] - Scientist, research (life sciences) [place] [facility]  Office  [phone]  CC: Primary care physician; Barbette Reichmann, MD

## 2016-02-10 NOTE — Progress Notes (Signed)
Nutrition Follow-up  DOCUMENTATION CODES:   Not applicable  INTERVENTION:  -Glucerna Shake po TID, each supplement provides 220 kcal and 10 grams of protein w/ diet advancement  NUTRITION DIAGNOSIS:   Inadequate oral intake related to altered GI function as evidenced by per patient/family report. -ongoing  GOAL:   Patient will meet greater than or equal to 90% of their needs -not meeting currently  MONITOR:   PO intake, Diet advancement, Supplement acceptance, Weight trends  ASSESSMENT:   Georgiann Cockerdith Hendry is a 82 y.o. female with a known history of Hypertension, hyperlipidemia and diabetes was in a usual state of health until 4 days ago when she began experiencing nausea and vomiting  Ms. Ziska has been NPO for the past 4 days with NGT to suction for possible ileus or gastroparesis, but NGT has been removed now. Had 3 small bm's yesterday.  No N/V today Hopefully advance to clears, per CM if she tolerates she will be ready for discharge tomorrow. No new wts RD will monitor intake, provide GS TID with advancement past CLD Labs and medications reviewed: CBG 211 KCL NS + K+ @ 4550mL/hr   Diet Order:  Diet NPO time specified Except for: Ice Chips, Sips with Meds  Skin:  Reviewed, no issues  Last BM:  9/18  Height:   Ht Readings from Last 1 Encounters:  02/04/16 4\' 11"  (1.499 m)    Weight:   Wt Readings from Last 1 Encounters:  02/04/16 122 lb 1.6 oz (55.4 kg)    Ideal Body Weight:     BMI:  Body mass index is 24.66 kg/m.  Estimated Nutritional Needs:   Kcal:  0102-72531375-1925 kcals/d  Protein:  66-83 g/d  Fluid:  >/=138175ml/d  EDUCATION NEEDS:   Education needs addressed  Dionne AnoWilliam M. Helyn Schwan, MS, RD LDN Inpatient Clinical Dietitian Pager [phone]

## 2016-02-10 NOTE — Progress Notes (Signed)
Physical Therapy Treatment Patient Details Name: Morgan Howard [place]meyer MRN: [id number] DOB: 1914-12-19 Today's Date: 02/10/2016    History of Present Illness 82 y/o female who was having nausea/vomiting and restless legs.  She was admitted with dehydration.  Medical Hx includes HTN, DM, HLD, and psoriasis.  Pt now with NG tube, N/V cleared.     PT Comments    PT agreeable to PT and reports feeling a little better. Pt no longer has NG tube and was able to tolerate a little food. Pt continues to require Min a for bed mobility and Min guard for sit to/from stand; better use of hands with transfer today. Pt demonstrating improved distance with ambulation today as well. Pt received up in chair comfortably. Continue PT to progress strength, endurance to allow for improved functional mobility and allow for an optimal, safe return home.   Follow Up Recommendations  Home health PT;Supervision/Assistance - 24 hour     Equipment Recommendations       Recommendations for Other Services       Precautions / Restrictions Precautions Precautions: Fall Restrictions Weight Bearing Restrictions: No    Mobility  Bed Mobility Overal bed mobility: Needs Assistance Bed Mobility: Supine to Sit     Supine to sit: Min assist     General bed mobility comments: HHA for trunk elevation and scooting to edge of bed  Transfers Overall transfer level: Needs assistance Equipment used: Rolling walker (2 wheeled) Transfers: Sit to/from Stand Sit to Stand: Min guard         General transfer comment: improved hand placement today; no LOB  Ambulation/Gait Ambulation/Gait assistance: Min guard Ambulation Distance (Feet): 95 Feet Assistive device: Rolling walker (2 wheeled) Gait Pattern/deviations: Step-through pattern;WFL(Within Functional Limits)   Gait velocity interpretation: at or above normal speed for age/gender (mildly under baseline) General Gait Details: Improved distance; requires occasional  cues to keep body closer to rw for improved UE support   Stairs            Wheelchair Mobility    Modified Rankin (Stroke Patients Only)       Balance Overall balance assessment: Needs assistance Sitting-balance support: Feet supported Sitting balance-Leahy Scale: Good     Standing balance support: Bilateral upper extremity supported Standing balance-Leahy Scale: Fair                      Cognition Arousal/Alertness: Awake/alert Behavior During Therapy: WFL for tasks assessed/performed Overall Cognitive Status: Within Functional Limits for tasks assessed                      Exercises      General Comments        Pertinent Vitals/Pain Pain Assessment: No/denies pain    Home Living                      Prior Function            PT Goals (current goals can now be found in the care plan section) Progress towards PT goals: Progressing toward goals    Frequency    Min 2X/week      PT Plan Current plan remains appropriate    Co-evaluation             End of Session Equipment Utilized During Treatment: Gait belt Activity Tolerance: Patient tolerated treatment well Patient left: in chair;with call bell/phone within reach;with chair alarm set;with family/visitor present  Time: 1610-9604 PT Time Calculation (min) (ACUTE ONLY): 23 min  Charges:  $Gait Training: 8-22 mins $Therapeutic Activity: 8-22 mins                    G Codes:      Scot Dock, PTA 02/10/2016, 4:08 PM

## 2016-02-10 NOTE — Care Management Note (Signed)
Case Management Note  Patient Details  Name: Morgan Howard MRN: [id number] Date of Birth: June 07, 1914  Subjective/Objective:  NG out. Case discussed with attending. Advancing diet today. Requested orders for SN, PT and HHA.  Met with Gregery Na [phone]) outside of room. Discussed discharge plan. Patient would benefit from SN, PT and HHA (at sitters request) at discharge. Sharyn Lull states they have used [facility] in the past and would like to use them again. Referral to [facility] with [facility]. It is anticipated that patient will be ready for discharge tomorrow if she is able to tolerate clear liquids today. .                   Action/Plan:   Expected Discharge Date:    02/11/2016              Expected Discharge Plan:  [place]  In-House Referral:     Discharge planning Services  CM Consult  Post Acute Care Choice:  Home Health Choice offered to:   (Care Giver)  DME Arranged:    DME Agency:     HH Arranged:  PT, RN, HHA [place] Agency:  [place]  Status of Service:  In process, will continue to follow  If discussed at Long Length of Stay Meetings, dates discussed:    Additional Comments:  Jolly Mango, RN 02/10/2016, 10:51 AM

## 2016-02-11 LAB — CBC
HCT: 36.6 % (ref 35.0–47.0)
Hemoglobin: 12.3 g/dL (ref 12.0–16.0)
MCH: 29.1 pg (ref 26.0–34.0)
MCHC: 33.6 g/dL (ref 32.0–36.0)
MCV: 86.8 fL (ref 80.0–100.0)
PLATELETS: 172 10*3/uL (ref 150–440)
RBC: 4.22 MIL/uL (ref 3.80–5.20)
RDW: 14.7 % — AB (ref 11.5–14.5)
WBC: 9.4 10*3/uL (ref 3.6–11.0)

## 2016-02-11 LAB — GLUCOSE, CAPILLARY
GLUCOSE-CAPILLARY: 231 mg/dL — AB (ref 65–99)
GLUCOSE-CAPILLARY: 240 mg/dL — AB (ref 65–99)

## 2016-02-11 MED ORDER — ROPINIROLE HCL 0.25 MG PO TABS: 0.5000 mg | ORAL_TABLET | Freq: Two times a day (BID) | ORAL | Status: DC

## 2016-02-11 MED ORDER — HYDRALAZINE HCL 20 MG/ML IJ SOLN
10.0000 mg | Freq: Four times a day (QID) | INTRAMUSCULAR | Status: DC | PRN
  Administered 2016-02-11: 10 mg via INTRAVENOUS
  Filled 2016-02-11: qty 1

## 2016-02-11 MED ORDER — ZOLPIDEM TARTRATE 5 MG PO TABS: 2.5000 mg | ORAL_TABLET | Freq: Every evening | ORAL | 0 refills | Status: AC | PRN

## 2016-02-11 MED ORDER — VITAMIN B-12 1000 MCG PO TABS: 1000.0000 ug | ORAL_TABLET | Freq: Every day | ORAL | Status: DC

## 2016-02-11 MED ORDER — DOCUSATE SODIUM 50 MG/5ML PO LIQD
50.0000 mg | Freq: Every day | ORAL | Status: DC
  Administered 2016-02-11: 50 mg via ORAL
  Filled 2016-02-11: qty 10

## 2016-02-11 MED ORDER — ASPIRIN EC 81 MG PO TBEC: 81.0000 mg | DELAYED_RELEASE_TABLET | Freq: Every day | ORAL | Status: DC

## 2016-02-11 MED ORDER — LOSARTAN POTASSIUM 50 MG PO TABS
25.0000 mg | ORAL_TABLET | Freq: Every day | ORAL | Status: DC
  Administered 2016-02-11: 25 mg via ORAL
  Filled 2016-02-11: qty 1

## 2016-02-11 MED ORDER — FERROUS SULFATE 325 (65 FE) MG PO TABS
325.0000 mg | ORAL_TABLET | Freq: Every day | ORAL | Status: DC
Start: 2016-02-11 — End: 2016-02-11

## 2016-02-11 MED ORDER — ZOLPIDEM TARTRATE 5 MG PO TABS: 2.5000 mg | ORAL_TABLET | Freq: Every evening | ORAL | Status: DC | PRN

## 2016-02-11 MED ORDER — PANTOPRAZOLE SODIUM 40 MG PO TBEC: 40.0000 mg | DELAYED_RELEASE_TABLET | Freq: Every day | ORAL | Status: DC

## 2016-02-11 MED ORDER — DOXAZOSIN MESYLATE 4 MG PO TABS
2.0000 mg | ORAL_TABLET | Freq: Every day | ORAL | Status: DC
  Administered 2016-02-11: 2 mg via ORAL
  Filled 2016-02-11: qty 1

## 2016-02-11 NOTE — Discharge Summary (Addendum)
[facility] - [place] at [facility]   PATIENT NAME: Morgan Howard    MR#:  [id number]  DATE OF BIRTH:  [birth year]  DATE OF ADMISSION:  02/04/2016 ADMITTING PHYSICIAN: Tonye Royalty, DO  DATE OF DISCHARGE: 9/201/7  PRIMARY CARE PHYSICIAN: Barbette Reichmann, MD    ADMISSION DIAGNOSIS:  vomitting   DISCHARGE DIAGNOSIS:  Ileus-resolved Dm-2 Malignant HTn SECONDARY DIAGNOSIS:   Past Medical History:  Diagnosis Date  . Diabetes (HCC)   . HLD (hyperlipidemia)   . HTN (hypertension)   . Psoriasis     HOSPITAL COURSE:   82 year old female with past medical history of hypertension, hyperlipidemia, diabetes, history of psoriasis who presented to the hospital due to intractable nausea and vomiting.  1. Intractable nausea and vomiting-etiology unclear. Suspected to be secondary to a /Ileus - NG tube has been removed--toerating clear and Fld. Advised small freq meals -Continue supportive care prn antiemetics.  -FLD -used dulcolax bid to stimulate bowels to gain peristalsis--make it prn -pt had 3 small BM's   2. DM - cont. SSI. No further Hypoglycemia.  - BS stable.   3. HTN - BP a bit high today but pt. Cannot take Oral meds.  -IV BB,nitropaste and IV hydralazine---change to her oral meds  4. GERD - cont. Protonix.   5. Restless Leg syndrome - cont. Requip.  -  PRN morphine ordered which as per nursing has helped.   6.PT recommends HHPT which has been arranged CONSULTS OBTAINED:    DRUG ALLERGIES:   Allergies  Allergen Reactions  . Biaxin [Clarithromycin]   . Lipitor [Atorvastatin]   . Lovastatin   . Nabumetone   . Penicillins     Has patient had a PCN reaction causing immediate rash, facial/tongue/throat swelling, SOB or lightheadedness with hypotension: No Has patient had a PCN reaction causing severe rash involving mucus membranes or skin necrosis: No Has patient had a PCN reaction that required hospitalization No Has patient  had a PCN reaction occurring within the last 10 years: No If all of the above answers are "NO", then may proceed with Cephalosporin use.  Laurita Quint   . Zantac [Ranitidine Hcl]     DISCHARGE MEDICATIONS:   Current Discharge Medication List    START taking these medications   Details  zolpidem (AMBIEN) 5 MG tablet Take 0.5 tablets (2.5 mg total) by mouth at bedtime as needed for sleep. Qty: 20 tablet, Refills: 0      CONTINUE these medications which have NOT CHANGED   Details  acetaminophen (TYLENOL) 500 MG tablet Take 500 mg by mouth daily.    aspirin EC 81 MG tablet Take 81 mg by mouth daily.    docusate sodium (COLACE) 50 MG capsule Take 50 mg by mouth daily.    doxazosin (CARDURA) 2 MG tablet Take 2 mg by mouth daily.    ferrous sulfate 325 (65 FE) MG tablet Take 325 mg by mouth daily with breakfast.    Insulin Glargine (LANTUS SOLOSTAR) 100 UNIT/ML Solostar Pen Inject 16 Units into the skin at bedtime. Qty: 15 mL, Refills: 11    losartan (COZAAR) 25 MG tablet Take 25 mg by mouth daily.    omeprazole (PRILOSEC) 20 MG capsule Take 20 mg by mouth daily.    rOPINIRole (REQUIP) 0.5 MG tablet Take 0.5 mg by mouth 2 (two) times daily.    sitaGLIPtin (JANUVIA) 25 MG tablet Take 25 mg by mouth daily.    vitamin B-12 (CYANOCOBALAMIN) 1000 MCG tablet Take 1,000 mcg by  mouth daily.      STOP taking these medications     insulin lispro (HUMALOG) 100 UNIT/ML KiwkPen      potassium chloride (K-DUR) 10 MEQ tablet         If you experience worsening of your admission symptoms, develop shortness of breath, life threatening emergency, suicidal or homicidal thoughts you must seek medical attention immediately by calling 911 or calling your MD immediately  if symptoms less severe.  You Must read complete instructions/literature along with all the possible adverse reactions/side effects for all the Medicines you take and that have been prescribed to you. Take any new Medicines  after you have completely understood and accept all the possible adverse reactions/side effects.   Please note  You were cared for by a hospitalist during your hospital stay. If you have any questions about your discharge medications or the care you received while you were in the hospital after you are discharged, you can call the unit and asked to speak with the hospitalist on call if the hospitalist that took care of you is not available. Once you are discharged, your primary care physician will handle any further medical issues. Please note that NO REFILLS for any discharge medications will be authorized once you are discharged, as it is imperative that you return to your primary care physician (or establish a relationship with a primary care physician if you do not have one) for your aftercare needs so that they can reassess your need for medications and monitor your lab values. Today   SUBJECTIVE   Doing well. Trying FLD  VITAL SIGNS:  Blood pressure (!) 184/82, pulse 98, temperature 97.7 F (36.5 C), temperature source Oral, resp. rate 19, height 4\' 11"  (1.499 m), weight 55.4 kg (122 lb 1.6 oz), SpO2 97 %.  I/O:    Intake/Output Summary (Last 24 hours) at 02/11/16 1400 Last data filed at 02/11/16 1207  Gross per 24 hour  Intake          3059.17 ml  Output                0 ml  Net          3059.17 ml    PHYSICAL EXAMINATION:  GENERAL:  82 y.o.-year-old patient lying in the bed with no acute distress.  EYES: Pupils equal, round, reactive to light and accommodation. No scleral icterus. Extraocular muscles intact.  HEENT: Head atraumatic, normocephalic. Oropharynx and nasopharynx clear.  NECK:  Supple, no jugular venous distention. No thyroid enlargement, no tenderness.  LUNGS: Normal breath sounds bilaterally, no wheezing, rales,rhonchi or crepitation. No use of accessory muscles of respiration.  CARDIOVASCULAR: S1, S2 normal. No murmurs, rubs, or gallops.  ABDOMEN: Soft,  non-tender, non-distended. Bowel sounds present. No organomegaly or mass.  EXTREMITIES: No pedal edema, cyanosis, or clubbing.  NEUROLOGIC: Cranial nerves II through XII are intact. Muscle strength 5/5 in all extremities. Sensation intact. Gait not checked.  PSYCHIATRIC: The patient is alert and oriented x 3.  SKIN: No obvious rash, lesion, or ulcer.   DATA REVIEW:   CBC   Recent Labs Lab 02/11/16 0411  WBC 9.4  HGB 12.3  HCT 36.6  PLT 172    Chemistries   Recent Labs Lab 02/04/16 2042  02/09/16 1524 02/10/16 0525  NA 137  < > 145  --   K 3.9  < > 2.8* 4.0  CL 98*  < > 113*  --   CO2 28  < > 21*  --  GLUCOSE 105*  < > 120*  --   BUN 31*  < > 25*  --   CREATININE 1.59*  < > 1.40*  --   CALCIUM 8.9  < > 8.0*  --   MG 2.6*  --   --   --   AST 19  --   --   --   ALT 28  --   --   --   ALKPHOS 85  --   --   --   BILITOT 0.7  --   --   --   < > = values in this interval not displayed.  Microbiology Results   No results found for this or any previous visit (from the past 240 hour(s)).  RADIOLOGY:  Dg Abd 1 View  Result Date: 02/10/2016 CLINICAL DATA:  Nausea and vomiting and dehydration EXAM: ABDOMEN - 1 VIEW COMPARISON:  02/07/2016 FINDINGS: Nasogastric catheter is again noted in the distal stomach. Contrast material is noted scattered throughout the colon stable from the prior exam. No obstructive changes are seen. No free air is noted. Bony structures show degenerative change of the lumbar spine. IMPRESSION: No acute abnormality noted. Nasogastric catheter within the stomach. Electronically Signed   By: Alcide CleverMark  Lukens M.D.   On: 02/10/2016 09:51     Management plans discussed with the patient, family and they are in agreement.  CODE STATUS:     Code Status Orders        Start     Ordered   02/05/16 0207  Full code  Continuous     02/05/16 0206    Code Status History    Date Active Date Inactive Code Status Order ID Comments User Context   01/02/2016  1:25  AM 01/02/2016  7:34 PM DNR [id number]  Oralia Manisavid Willis, MD Inpatient    Advance Directive Documentation   Flowsheet Row Most Recent Value  Type of Advance Directive  Healthcare Power of Attorney, Living will  Pre-existing out of facility DNR order (yellow form or pink MOST form)  No data  "MOST" Form in Place?  No data      TOTAL TIME TAKING CARE OF THIS PATIENT: 40 minutes.    Cuahutemoc Attar M.D on 02/11/2016 at 2:00 PM  Between 7am to 6pm - Pager - [phone] After 6pm go to [email or web address] - password [email or web address] [facility]  [facility]  Office  [phone]  CC: Primary care physician; Barbette ReichmannHANDE,VISHWANATH, MD

## 2016-02-11 NOTE — Progress Notes (Signed)
Patient discharging to home. IV removed. Instructions gone over with patient, and pt's caregiver, michelle.  Follow up MD apt with Dr Marcello FennelHande this Friday, 9/22. Pt stable, with no complaints of pain and no further nausea/vomiting. Patient left unit with caregiver and volunteer via wheelchair.

## 2016-02-11 NOTE — Care Management Note (Signed)
Case Management Note  Patient Details  Name: Morgan Howard MRN: [id number] Date of Birth: 10-12-1914  Subjective/Objective:  Clarified home health discharge plan with Iva LentoMichelle Ortega, patient care giver. Advanced notified of discharge. No DME needed                  Action/Plan:   Expected Discharge Date:    02/11/16              Expected Discharge Plan:  Home w Home Health Services  In-House Referral:     Discharge planning Services  CM Consult  Post Acute Care Choice:  Home Health Choice offered to:   (Care Giver)  DME Arranged:    DME Agency:     HH Arranged:  PT, RN, Nurse's Aide HH Agency:  [facility]  Status of Service:  Completed, signed off  If discussed at Long Length of Stay Meetings, dates discussed:    Additional Comments:  [name] Eveleigh Crumpler, RN 02/11/2016, 9:38 AM

## 2016-02-11 NOTE — Discharge Instructions (Signed)
HHPT °

## 2016-02-11 NOTE — Care Management Important Message (Signed)
Important Message  Patient Details  Name: Morgan Howard MRN: [id number] Date of Birth: 1914/10/23   Medicare Important Message Given:  Yes    [name] [name], RN 02/11/2016, 9:39 AM

## 2016-02-11 NOTE — Progress Notes (Signed)
Pt blood pressure 184/82 notified Dr patel. She ordered to give 10 mg IV hydralazine pRn once, Recheck BP in 30 minutes. If BP improved patient can discharge home, where she has home BP medications.

## 2016-02-12 ENCOUNTER — Emergency Department: Payer: Medicare Other

## 2016-02-12 ENCOUNTER — Inpatient Hospital Stay
Admission: EM | Admit: 2016-02-12 | Discharge: 2016-02-22 | DRG: 389 | Disposition: E | Payer: Medicare Other | Attending: Family Medicine | Admitting: Family Medicine

## 2016-02-12 DIAGNOSIS — I129 Hypertensive chronic kidney disease with stage 1 through stage 4 chronic kidney disease, or unspecified chronic kidney disease: Secondary | ICD-10-CM | POA: Diagnosis present

## 2016-02-12 DIAGNOSIS — Z79899 Other long term (current) drug therapy: Secondary | ICD-10-CM

## 2016-02-12 DIAGNOSIS — K567 Ileus, unspecified: Secondary | ICD-10-CM | POA: Diagnosis not present

## 2016-02-12 DIAGNOSIS — E860 Dehydration: Secondary | ICD-10-CM | POA: Diagnosis present

## 2016-02-12 DIAGNOSIS — N189 Chronic kidney disease, unspecified: Secondary | ICD-10-CM | POA: Diagnosis present

## 2016-02-12 DIAGNOSIS — R7989 Other specified abnormal findings of blood chemistry: Secondary | ICD-10-CM | POA: Diagnosis not present

## 2016-02-12 DIAGNOSIS — G2581 Restless legs syndrome: Secondary | ICD-10-CM | POA: Diagnosis present

## 2016-02-12 DIAGNOSIS — K219 Gastro-esophageal reflux disease without esophagitis: Secondary | ICD-10-CM | POA: Diagnosis present

## 2016-02-12 DIAGNOSIS — E870 Hyperosmolality and hypernatremia: Secondary | ICD-10-CM | POA: Diagnosis present

## 2016-02-12 DIAGNOSIS — Z66 Do not resuscitate: Secondary | ICD-10-CM | POA: Diagnosis present

## 2016-02-12 DIAGNOSIS — R778 Other specified abnormalities of plasma proteins: Secondary | ICD-10-CM

## 2016-02-12 DIAGNOSIS — Z7982 Long term (current) use of aspirin: Secondary | ICD-10-CM

## 2016-02-12 DIAGNOSIS — Z888 Allergy status to other drugs, medicaments and biological substances status: Secondary | ICD-10-CM

## 2016-02-12 DIAGNOSIS — E119 Type 2 diabetes mellitus without complications: Secondary | ICD-10-CM

## 2016-02-12 DIAGNOSIS — R1110 Vomiting, unspecified: Secondary | ICD-10-CM

## 2016-02-12 DIAGNOSIS — E1122 Type 2 diabetes mellitus with diabetic chronic kidney disease: Secondary | ICD-10-CM | POA: Diagnosis present

## 2016-02-12 DIAGNOSIS — Z90710 Acquired absence of both cervix and uterus: Secondary | ICD-10-CM

## 2016-02-12 DIAGNOSIS — Z515 Encounter for palliative care: Secondary | ICD-10-CM | POA: Diagnosis present

## 2016-02-12 DIAGNOSIS — I4891 Unspecified atrial fibrillation: Secondary | ICD-10-CM | POA: Diagnosis present

## 2016-02-12 DIAGNOSIS — Z9049 Acquired absence of other specified parts of digestive tract: Secondary | ICD-10-CM

## 2016-02-12 DIAGNOSIS — Z823 Family history of stroke: Secondary | ICD-10-CM

## 2016-02-12 DIAGNOSIS — Z8249 Family history of ischemic heart disease and other diseases of the circulatory system: Secondary | ICD-10-CM

## 2016-02-12 DIAGNOSIS — Z803 Family history of malignant neoplasm of breast: Secondary | ICD-10-CM

## 2016-02-12 DIAGNOSIS — E785 Hyperlipidemia, unspecified: Secondary | ICD-10-CM | POA: Diagnosis present

## 2016-02-12 DIAGNOSIS — N183 Chronic kidney disease, stage 3 (moderate): Secondary | ICD-10-CM | POA: Diagnosis present

## 2016-02-12 DIAGNOSIS — E876 Hypokalemia: Secondary | ICD-10-CM | POA: Diagnosis present

## 2016-02-12 DIAGNOSIS — Z880 Allergy status to penicillin: Secondary | ICD-10-CM

## 2016-02-12 DIAGNOSIS — Z794 Long term (current) use of insulin: Secondary | ICD-10-CM

## 2016-02-12 LAB — CBC WITH DIFFERENTIAL/PLATELET
Basophils Absolute: 0 10*3/uL (ref 0–0.1)
Basophils Relative: 0 %
Eosinophils Absolute: 0 10*3/uL (ref 0–0.7)
Eosinophils Relative: 0 %
HEMATOCRIT: 42.8 % (ref 35.0–47.0)
HEMOGLOBIN: 13.6 g/dL (ref 12.0–16.0)
LYMPHS ABS: 0.4 10*3/uL — AB (ref 1.0–3.6)
LYMPHS PCT: 4 %
MCH: 28.2 pg (ref 26.0–34.0)
MCHC: 31.8 g/dL — AB (ref 32.0–36.0)
MCV: 88.6 fL (ref 80.0–100.0)
MONOS PCT: 4 %
Monocytes Absolute: 0.3 10*3/uL (ref 0.2–0.9)
NEUTROS ABS: 7.5 10*3/uL — AB (ref 1.4–6.5)
NEUTROS PCT: 92 %
Platelets: 164 10*3/uL (ref 150–440)
RBC: 4.82 MIL/uL (ref 3.80–5.20)
RDW: 15.1 % — ABNORMAL HIGH (ref 11.5–14.5)
WBC: 8.1 10*3/uL (ref 3.6–11.0)

## 2016-02-12 LAB — COMPREHENSIVE METABOLIC PANEL
ALT: 26 U/L (ref 14–54)
ANION GAP: 18 — AB (ref 5–15)
AST: 21 U/L (ref 15–41)
Albumin: 3.6 g/dL (ref 3.5–5.0)
Alkaline Phosphatase: 76 U/L (ref 38–126)
BUN: 34 mg/dL — ABNORMAL HIGH (ref 6–20)
CHLORIDE: 114 mmol/L — AB (ref 101–111)
CO2: 16 mmol/L — AB (ref 22–32)
CREATININE: 1.52 mg/dL — AB (ref 0.44–1.00)
Calcium: 9.1 mg/dL (ref 8.9–10.3)
GFR calc non Af Amer: 27 mL/min — ABNORMAL LOW (ref >60)
GFR, EST AFRICAN AMERICAN: 31 mL/min — AB (ref >60)
Glucose, Bld: 363 mg/dL — ABNORMAL HIGH (ref 65–99)
POTASSIUM: 3.6 mmol/L (ref 3.5–5.1)
SODIUM: 148 mmol/L — AB (ref 135–145)
Total Bilirubin: 0.8 mg/dL (ref 0.3–1.2)
Total Protein: 6.8 g/dL (ref 6.5–8.1)

## 2016-02-12 LAB — LACTIC ACID, PLASMA
Lactic Acid, Venous: 1.6 mmol/L (ref 0.5–1.9)
Lactic Acid, Venous: 1.3 mmol/L (ref 0.5–1.9)

## 2016-02-12 LAB — BLOOD GAS, VENOUS
ACID-BASE DEFICIT: 7.3 mmol/L — AB (ref 0.0–2.0)
BICARBONATE: 16.8 mmol/L — AB (ref 20.0–28.0)
O2 Saturation: 78.1 %
PCO2 VEN: 29 mmHg — AB (ref 44.0–60.0)
PH VEN: 7.37 (ref 7.250–7.430)
Patient temperature: 37
pO2, Ven: 44 mmHg (ref 32.0–45.0)

## 2016-02-12 LAB — LIPASE, BLOOD: LIPASE: 33 U/L (ref 11–51)

## 2016-02-12 LAB — GLUCOSE, CAPILLARY: GLUCOSE-CAPILLARY: 306 mg/dL — AB (ref 65–99)

## 2016-02-12 LAB — TROPONIN I: Troponin I: 0.06 ng/mL (ref <0.03)

## 2016-02-12 MED ORDER — ONDANSETRON HCL 4 MG/2ML IJ SOLN
4.0000 mg | Freq: Four times a day (QID) | INTRAMUSCULAR | Status: DC | PRN
  Administered 2016-02-12 – 2016-02-13 (×3): 4 mg via INTRAVENOUS
  Filled 2016-02-12 (×3): qty 2

## 2016-02-12 MED ORDER — METOCLOPRAMIDE HCL 5 MG/ML IJ SOLN
10.0000 mg | Freq: Once | INTRAMUSCULAR | Status: AC
  Administered 2016-02-12: 10 mg via INTRAVENOUS

## 2016-02-12 MED ORDER — SODIUM CHLORIDE 0.9 % IV BOLUS (SEPSIS)
500.0000 mL | Freq: Once | INTRAVENOUS | Status: AC
  Administered 2016-02-12: 500 mL via INTRAVENOUS

## 2016-02-12 MED ORDER — METOCLOPRAMIDE HCL 5 MG/ML IJ SOLN
INTRAMUSCULAR | Status: AC
  Administered 2016-02-12: 10 mg via INTRAVENOUS
  Filled 2016-02-12: qty 2

## 2016-02-12 MED ORDER — ONDANSETRON HCL 4 MG PO TABS: 4.0000 mg | ORAL_TABLET | Freq: Four times a day (QID) | ORAL | Status: DC | PRN

## 2016-02-12 NOTE — ED Notes (Signed)
Pt emesis X 1 time, green in color while having xray done.

## 2016-02-12 NOTE — ED Notes (Signed)
Pt left ED with Morgan NeighborsSarah Howard, NT .  NT was delayed in transport to the floor. Pt's friend was aware of delay because of nurse update. Floor called nurse and nurse explained delay.

## 2016-02-12 NOTE — H&P (Signed)
[facility] [place] [facility]   PATIENT NAME: Morgan Howard    MR#:  [id number]  DATE OF BIRTH:  05/15/1915  DATE OF ADMISSION:  02/09/2016  PRIMARY CARE PHYSICIAN: Barbette ReichmannHANDE,VISHWANATH, MD   REQUESTING/REFERRING PHYSICIAN: Roxan Hockeyobinson, MD  CHIEF COMPLAINT:   Chief Complaint  Patient presents with  . Vomiting    HISTORY OF PRESENT ILLNESS:  Morgan Cockerdith Yackel  is a 82 y.o. female who presents with Increased nausea vomiting for the past 24 hours. Patient was discharged 24 hours ago after hospital stay with a diagnosis of ileus. She states that she has a same symptoms again, they've grown worse since she was discharged from the hospital she is unable to keep anything down by mouth. She does have some mild electrolyte derangements with very mild hypernatremia, as well as some clinical dehydration. Patient states [facility] and told she would have GI see her in the hospital for EGD.  Hospitalists were called for evaluation.  PAST MEDICAL HISTORY:   Past Medical History:  Diagnosis Date  . Diabetes (HCC)   . HLD (hyperlipidemia)   . HTN (hypertension)   . Psoriasis     PAST SURGICAL HISTORY:   Past Surgical History:  Procedure Laterality Date  . ABDOMINAL HYSTERECTOMY    . APPENDECTOMY    . CHOLECYSTECTOMY      SOCIAL HISTORY:   Social History  Substance Use Topics  . Smoking status: Never Smoker  . Smokeless tobacco: Never Used  . Alcohol use No    FAMILY HISTORY:   Family History  Problem Relation Age of Onset  . Stroke Mother   . Heart attack Father   . Breast cancer Sister     DRUG ALLERGIES:   Allergies  Allergen Reactions  . Biaxin [Clarithromycin]   . Lipitor [Atorvastatin]   . Lovastatin   . Nabumetone   . Penicillins     Has patient had a PCN reaction causing immediate rash, facial/tongue/throat swelling, SOB or lightheadedness with hypotension: No Has patient had a PCN reaction causing  severe rash involving mucus membranes or skin necrosis: No Has patient had a PCN reaction that required hospitalization No Has patient had a PCN reaction occurring within the last 10 years: No If all of the above answers are "NO", then may proceed with Cephalosporin use.  Laurita Quint. Psoralea   . Zantac [Ranitidine Hcl]     MEDICATIONS AT HOME:   Prior to Admission medications   Medication Sig Start Date End Date Taking? Authorizing Provider  acetaminophen (TYLENOL) 500 MG tablet Take 500 mg by mouth daily.    Historical Provider, MD  aspirin EC 81 MG tablet Take 81 mg by mouth daily.    Historical Provider, MD  docusate sodium (COLACE) 50 MG capsule Take 50 mg by mouth daily.    Historical Provider, MD  doxazosin (CARDURA) 2 MG tablet Take 2 mg by mouth daily.    Historical Provider, MD  ferrous sulfate 325 (65 FE) MG tablet Take 325 mg by mouth daily with breakfast.    Historical Provider, MD  Insulin Glargine (LANTUS SOLOSTAR) 100 UNIT/ML Solostar Pen Inject 16 Units into the skin at bedtime. Patient taking differently: Inject 26 Units into the skin at bedtime.  01/02/16   Enedina FinnerSona Patel, MD  losartan (COZAAR) 25 MG tablet Take 25 mg by mouth daily.    Historical Provider, MD  omeprazole (PRILOSEC) 20 MG capsule Take 20 mg by mouth daily.  Historical Provider, MD  rOPINIRole (REQUIP) 0.5 MG tablet Take 0.5 mg by mouth 2 (two) times daily.    Historical Provider, MD  sitaGLIPtin (JANUVIA) 25 MG tablet Take 25 mg by mouth daily.    Historical Provider, MD  vitamin B-12 (CYANOCOBALAMIN) 1000 MCG tablet Take 1,000 mcg by mouth daily.    Historical Provider, MD  zolpidem (AMBIEN) 5 MG tablet Take 0.5 tablets (2.5 mg total) by mouth at bedtime as needed for sleep. 02/11/16   Enedina Finner, MD    REVIEW OF SYSTEMS:  Review of Systems  Constitutional: Negative for chills, fever, malaise/fatigue and weight loss.  HENT: Negative for ear pain, hearing loss and tinnitus.   Eyes: Negative for blurred vision,  double vision, pain and redness.  Respiratory: Negative for cough, hemoptysis and shortness of breath.   Cardiovascular: Negative for chest pain, palpitations, orthopnea and leg swelling.  Gastrointestinal: Positive for nausea and vomiting. Negative for abdominal pain, constipation and diarrhea.  Genitourinary: Negative for dysuria, frequency and hematuria.  Musculoskeletal: Negative for back pain, joint pain and neck pain.  Skin:       No acne, rash, or lesions  Neurological: Negative for dizziness, tremors, focal weakness and weakness.  Endo/Heme/Allergies: Negative for polydipsia. Does not bruise/bleed easily.  Psychiatric/Behavioral: Negative for depression. The patient is not nervous/anxious and does not have insomnia.      VITAL SIGNS:   Vitals:   03/06/2016 1809 March 06, 2016 1810 March 06, 2016 1817 06-Mar-2016 1900  BP:   (!) 161/81 (!) 146/84  Pulse: (!) 109   (!) 111  Resp: 18   15  Temp: 97.7 F (36.5 C)     TempSrc: Oral     SpO2: 98%   99%  Weight:  52.2 kg (115 lb)    Height:  4\' 11"  (1.499 m)     Wt Readings from Last 3 Encounters:  2016-03-06 52.2 kg (115 lb)  02/04/16 55.4 kg (122 lb 1.6 oz)  01/02/16 54.2 kg (119 lb 8 oz)    PHYSICAL EXAMINATION:  Physical Exam  Vitals reviewed. Constitutional: She is oriented to person, place, and time. She appears well-developed and well-nourished. No distress.  HENT:  Head: Normocephalic and atraumatic.  Mouth/Throat: Oropharynx is clear and moist.  Eyes: Conjunctivae and EOM are normal. Pupils are equal, round, and reactive to light. No scleral icterus.  Neck: Normal range of motion. Neck supple. No JVD present. No thyromegaly present.  Cardiovascular: Normal rate and intact distal pulses.  Exam reveals no gallop and no friction rub.   No murmur heard. Irregular rhythm  Respiratory: Effort normal and breath sounds normal. No respiratory distress. She has no wheezes. She has no rales.  GI: Soft. Bowel sounds are normal. She exhibits  no distension. There is no tenderness.  Musculoskeletal: Normal range of motion. She exhibits no edema.  No arthritis, no gout  Lymphadenopathy:    She has no cervical adenopathy.  Neurological: She is alert and oriented to person, place, and time. No cranial nerve deficit.  No dysarthria, no aphasia  Skin: Skin is warm and dry. No rash noted. No erythema.  Psychiatric: She has a normal mood and affect. Her behavior is normal. Judgment and thought content normal.    LABORATORY PANEL:   CBC  Recent Labs Lab 03/06/16 1812  WBC 8.1  HGB 13.6  HCT 42.8  PLT 164   ------------------------------------------------------------------------------------------------------------------  Chemistries   Recent Labs Lab 03-06-2016 1812  NA 148*  K 3.6  CL 114*  CO2  16*  GLUCOSE 363*  BUN 34*  CREATININE 1.52*  CALCIUM 9.1  AST 21  ALT 26  ALKPHOS 76  BILITOT 0.8   ------------------------------------------------------------------------------------------------------------------  Cardiac Enzymes  Recent Labs Lab 02/15/2016 1812  TROPONINI 0.06*   ------------------------------------------------------------------------------------------------------------------  RADIOLOGY:  Ct Head Wo Contrast  Result Date: 02/20/2016 CLINICAL DATA:  Acute nausea and vomiting, cramping and spasm, concern for stroke. EXAM: CT HEAD WITHOUT CONTRAST TECHNIQUE: Contiguous axial images were obtained from the base of the skull through the vertex without intravenous contrast. COMPARISON:  01/01/2016. FINDINGS: Brain: No evidence for acute infarction, hemorrhage, mass lesion, hydrocephalus, or extra-axial fluid. Moderate atrophy. Chronic microvascular ischemic change. Vascular: Vascular calcification without hyperdense vessel. Skull: Normal. Negative for fracture or focal lesion. Sinuses/Orbits: No acute finding. Other: None. Compared with priors, similar appearance. IMPRESSION: Atrophy and small vessel  disease.  No acute intracranial findings. Electronically Signed   By: Elsie Stain M.D.   On: 02/21/2016 20:27   Dg Abdomen Acute W/chest  Result Date: 02/03/2016 CLINICAL DATA:  Vomiting several times last night. EXAM: DG ABDOMEN ACUTE W/ 1V CHEST COMPARISON:  02/10/2016 FINDINGS: The upright chest x-ray is normal. The cardiac silhouette, mediastinal and hilar contours are within normal limits. There is tortuosity and calcification of the thoracic aorta. The lungs are clear. Stable degenerative changes involving both shoulders with multiple loose calcified bodies noted on the right. Two views of the abdomen demonstrate residual contrast in the colon from a prior recent CT scan. No dilated small bowel loops to suggest obstruction. No free air. The bony structures are intact. IMPRESSION: No acute cardiopulmonary findings. No plain film findings for an acute abdominal process. Electronically Signed   By: Rudie Meyer M.D.   On: 02/09/2016 19:24    EKG:   Orders placed or performed during the hospital encounter of 02/17/2016  . ED EKG  . ED EKG  . EKG 12-Lead  . EKG 12-Lead    IMPRESSION AND PLAN:  Principal Problem:   Ileus (HCC) - reglan for promotility, NPO for now, prn antiemetics, IV fluids for hydration, GI consult Active Problems:   Dehydration - IV fluids as above   Diabetes (HCC) - SSI q6 hours while NPO   A-fib (HCC) - cont home meds, currently rate controlled   GERD (gastroesophageal reflux disease) - home dose ppi   RLS (restless legs syndrome) - home meds   CKD (chronic kidney disease) - at baseline, avoid nephrotoxins  All the records are reviewed and case discussed with ED provider. Management plans discussed with the patient and/or family.  DVT PROPHYLAXIS: SubQ heparin  GI PROPHYLAXIS: PPI  ADMISSION STATUS: Observation  CODE STATUS: DNR Code Status History    Date Active Date Inactive Code Status Order ID Comments User Context   02/05/2016  2:06 AM 02/11/2016  5:33  PM Full Code [id number]  Tonye Royalty, DO Inpatient   01/02/2016  1:25 AM 01/02/2016  7:34 PM DNR [id number]  Oralia Manis, MD Inpatient    Advance Directive Documentation   Flowsheet Row Most Recent Value  Type of Advance Directive  Healthcare Power of Attorney, Living will  Pre-existing out of facility DNR order (yellow form or pink MOST form)  No data  "MOST" Form in Place?  No data      TOTAL TIME TAKING CARE OF THIS PATIENT: 40 minutes.    Margeaux Swantek FIELDING 01/26/2016, 8:59 PM  [employer]  Office  [phone]  CC: Primary care physician; Barbette Reichmann, MD

## 2016-02-12 NOTE — ED Notes (Addendum)
Pt stating that she is not nauseated at this time, but is still feeling bad. Pt denying any needs at this time. Friends at bedside. Pt's IV site intact. Pt has not been passing any gas since D/C yesterday.

## 2016-02-12 NOTE — ED Provider Notes (Signed)
[facility] Emergency Department Provider Note    First MD Initiated Contact with Patient 01/28/2016 1808     (approximate)  I have reviewed the triage vital signs and the nursing notes.   HISTORY  Chief Complaint Vomiting    HPI Morgan Howard is a 82 y.o. female  with a history of diabetes and hypertension presents to the ER 24 hours after discharge for nausea and vomiting. Patient had extensive workup for epigastric discomfort and nausea and vomiting. Was diagnosed with an ileus and transition to by mouth and was able to be discharged home however over the past 24 hours has had worsening nausea and inability to tolerate oral medications, fluids. Have also noticed over the past 24 hours at her left upper extremity is spasming and stiff. Has not noticed any facial droop. He has no history of CVA. Is not on any blood thinners. Denies any chest pain or shortness of breath. Denies any fevers. Has not moved her bowels in 24 hours.   Past Medical History:  Diagnosis Date  . Diabetes (HCC)   . HLD (hyperlipidemia)   . HTN (hypertension)   . Psoriasis     Patient Active Problem List   Diagnosis Date Noted  . Dehydration 02/05/2016  . Diabetes (HCC) 01/01/2016  . Hypoglycemia 01/01/2016  . Nausea & vomiting 01/01/2016  . GERD (gastroesophageal reflux disease) 01/01/2016    Past Surgical History:  Procedure Laterality Date  . ABDOMINAL HYSTERECTOMY    . APPENDECTOMY    . CHOLECYSTECTOMY      Prior to Admission medications   Medication Sig Start Date End Date Taking? Authorizing Provider  acetaminophen (TYLENOL) 500 MG tablet Take 500 mg by mouth daily.    Historical Provider, MD  aspirin EC 81 MG tablet Take 81 mg by mouth daily.    Historical Provider, MD  docusate sodium (COLACE) 50 MG capsule Take 50 mg by mouth daily.    Historical Provider, MD  doxazosin (CARDURA) 2 MG tablet Take 2 mg by mouth daily.    Historical Provider, MD  ferrous  sulfate 325 (65 FE) MG tablet Take 325 mg by mouth daily with breakfast.    Historical Provider, MD  Insulin Glargine (LANTUS SOLOSTAR) 100 UNIT/ML Solostar Pen Inject 16 Units into the skin at bedtime. Patient taking differently: Inject 26 Units into the skin at bedtime.  01/02/16   Enedina Finner, MD  losartan (COZAAR) 25 MG tablet Take 25 mg by mouth daily.    Historical Provider, MD  omeprazole (PRILOSEC) 20 MG capsule Take 20 mg by mouth daily.    Historical Provider, MD  rOPINIRole (REQUIP) 0.5 MG tablet Take 0.5 mg by mouth 2 (two) times daily.    Historical Provider, MD  sitaGLIPtin (JANUVIA) 25 MG tablet Take 25 mg by mouth daily.    Historical Provider, MD  vitamin B-12 (CYANOCOBALAMIN) 1000 MCG tablet Take 1,000 mcg by mouth daily.    Historical Provider, MD  zolpidem (AMBIEN) 5 MG tablet Take 0.5 tablets (2.5 mg total) by mouth at bedtime as needed for sleep. 02/11/16   Enedina Finner, MD    Allergies Biaxin [clarithromycin]; Lipitor [atorvastatin]; Lovastatin; Nabumetone; Penicillins; Psoralea; and Zantac [ranitidine hcl]  Family History  Problem Relation Age of Onset  . Stroke Mother   . Heart attack Father   . Breast cancer Sister     Social History Social History  Substance Use Topics  . Smoking status: Never Smoker  . Smokeless tobacco: Never Used  .  Alcohol use No    Review of Systems Patient denies headaches, rhinorrhea, blurry vision, numbness, shortness of breath, chest pain, edema, cough, abdominal pain, nausea, vomiting, diarrhea, dysuria, fevers, rashes or hallucinations unless otherwise stated above in HPI. ____________________________________________   PHYSICAL EXAM:  VITAL SIGNS: Vitals:   02/10/2016 1817 01/31/2016 1900  BP: (!) 161/81 (!) 146/84  Pulse:  (!) 111  Resp:  15  Temp:      Constitutional: Alert and oriented. Elderly and frail-appearing Eyes: Conjunctivae are normal. PERRL. EOMI. Head: Atraumatic. Nose: No congestion/rhinnorhea. Mouth/Throat:  Mucous membranes are moist.  Oropharynx non-erythematous. Neck: No stridor. Painless ROM. No cervical spine tenderness to palpation Hematological/Lymphatic/Immunilogical: No cervical lymphadenopathy. Cardiovascular: Normal rate, regular rhythm. Grossly normal heart sounds.  Good peripheral circulation. Respiratory: Normal respiratory effort.  No retractions. Lungs CTAB. Gastrointestinal: Soft and nontender. No distention. No abdominal bruits. No CVA tenderness. Genitourinary:  Musculoskeletal: No lower extremity tenderness nor edema.  No joint effusions. Neurologic:  Normal speech and language. No facial droop. She does have spasticity of the left upper extremity involving the flexor muscles of the hand and biceps tendon. She is no clonus. No hyperreflexic ED. Sensation is intact.. Skin:  Skin is warm, dry and intact. No rash noted. Psychiatric: Mood and affect are normal. Speech and behavior are normal.  ____________________________________________   LABS (all labs ordered are listed, but only abnormal results are displayed)  Results for orders placed or performed during the hospital encounter of 02/14/2016 (from the past 24 hour(s))  Lipase, blood     Status: None   Collection Time: 02/02/2016  6:12 PM  Result Value Ref Range   Lipase 33 11 - 51 U/L  Comprehensive metabolic panel     Status: Abnormal   Collection Time: 01/23/2016  6:12 PM  Result Value Ref Range   Sodium 148 (H) 135 - 145 mmol/L   Potassium 3.6 3.5 - 5.1 mmol/L   Chloride 114 (H) 101 - 111 mmol/L   CO2 16 (L) 22 - 32 mmol/L   Glucose, Bld 363 (H) 65 - 99 mg/dL   BUN 34 (H) 6 - 20 mg/dL   Creatinine, Ser 1.611.52 (H) 0.44 - 1.00 mg/dL   Calcium 9.1 8.9 - 09.610.3 mg/dL   Total Protein 6.8 6.5 - 8.1 g/dL   Albumin 3.6 3.5 - 5.0 g/dL   AST 21 15 - 41 U/L   ALT 26 14 - 54 U/L   Alkaline Phosphatase 76 38 - 126 U/L   Total Bilirubin 0.8 0.3 - 1.2 mg/dL   GFR calc non Af Amer 27 (L) >60 mL/min   GFR calc Af Amer 31 (L) >60  mL/min   Anion gap 18 (H) 5 - 15  CBC with Differential/Platelet     Status: Abnormal   Collection Time: 02/15/2016  6:12 PM  Result Value Ref Range   WBC 8.1 3.6 - 11.0 K/uL   RBC 4.82 3.80 - 5.20 MIL/uL   Hemoglobin 13.6 12.0 - 16.0 g/dL   HCT 04.542.8 40.935.0 - 81.147.0 %   MCV 88.6 80.0 - 100.0 fL   MCH 28.2 26.0 - 34.0 pg   MCHC 31.8 (L) 32.0 - 36.0 g/dL   RDW 91.415.1 (H) 78.211.5 - 95.614.5 %   Platelets 164 150 - 440 K/uL   Neutrophils Relative % 92 %   Neutro Abs 7.5 (H) 1.4 - 6.5 K/uL   Lymphocytes Relative 4 %   Lymphs Abs 0.4 (L) 1.0 - 3.6 K/uL   Monocytes Relative  4 %   Monocytes Absolute 0.3 0.2 - 0.9 K/uL   Eosinophils Relative 0 %   Eosinophils Absolute 0.0 0 - 0.7 K/uL   Basophils Relative 0 %   Basophils Absolute 0.0 0 - 0.1 K/uL  Lactic acid, plasma     Status: None   Collection Time: 02/02/2016  6:12 PM  Result Value Ref Range   Lactic Acid, Venous 1.6 0.5 - 1.9 mmol/L  Troponin I     Status: Abnormal   Collection Time: 02/03/2016  6:12 PM  Result Value Ref Range   Troponin I 0.06 (HH) <0.03 ng/mL  Glucose, capillary     Status: Abnormal   Collection Time: 02/19/2016  7:27 PM  Result Value Ref Range   Glucose-Capillary 306 (H) 65 - 99 mg/dL   ____________________________________________  EKG My review and personal interpretation at Time: 18:25   Indication: nausea  Rate: 110  Rhythm: nsr Axis: normal Other: limite4d due to baseline artifact, no significant ST changes ____________________________________________  RADIOLOGY  See chart ____________________________________________   PROCEDURES  Procedure(s) performed: none    Critical Care performed: no ____________________________________________   INITIAL IMPRESSION / ASSESSMENT AND PLAN / ED COURSE  Pertinent labs & imaging results that were available during my care of the patient were reviewed by me and considered in my medical decision making (see chart for details).  DDX: Ileus, SBO, electrolyte abnormality,  CVA, dehydration, failure to thrive.  CARIANN KINNAMON is a 82 y.o. who presents to the ED with Recent admission for nausea or vomiting presents 24 hours after discharge for similar symptoms. Unremarkable abdominal exam. Does have mild tachycardia. Based on her recent admission and do feel this is likely secondary to persistent ileus. Patient clearly not taking oral medications or hydration. X-ray does not show any evidence of obstruction or gastric dilatation. Do not feel NG tube emergently indicated at this time. She does have a mildly elevated troponin to 0.06 but no evidence of acute ischemia on EKG. This could be secondary to recurrent nausea and vomiting, dehydration or demand ischemia in the setting of dehydration and elevated blood pressure with tachycardia. Lactic acid is normal.  Symptoms improved with IV antiemetics but based on her persistent tachycardia and inability to tolerate oral hydration or medications do feel patient will require admission.  Clinical Course     ____________________________________________   FINAL CLINICAL IMPRESSION(S) / ED DIAGNOSES  Final diagnoses:  Intractable vomiting with nausea, vomiting of unspecified type  Elevated troponin I level      NEW MEDICATIONS STARTED DURING THIS VISIT:  New Prescriptions   No medications on file     Note:  This document was prepared using Dragon voice recognition software and may include unintentional dictation errors.    Willy Eddy, MD 02/19/2016 [phone]

## 2016-02-12 NOTE — ED Notes (Signed)
Dr. Roxan Hockeyobinson notified of pt's troponin level being 0.06.

## 2016-02-12 NOTE — ED Triage Notes (Signed)
PT arrives to ER via POV c/o vomiting. Pt states that she was discharged from hospital yesterday after being on bowel rest with an NG tube. Pt had small BM before discharge yesterday. No BM today. Emesis X 7-8 times since last night. Pt alert and oriented X4, active, cooperative, pt in NAD. RR even and unlabored, color WNL.

## 2016-02-12 NOTE — ED Notes (Signed)
Pt was sleeping on nurse's arrival to room. Family friend is sitting at bedside. Dr. Merilynn Finlandobertson is at bedside.

## 2016-02-12 NOTE — ED Notes (Signed)
Per family friend pt just passed a large amount of gas.

## 2016-02-13 DIAGNOSIS — E1122 Type 2 diabetes mellitus with diabetic chronic kidney disease: Secondary | ICD-10-CM | POA: Diagnosis present

## 2016-02-13 DIAGNOSIS — Z888 Allergy status to other drugs, medicaments and biological substances status: Secondary | ICD-10-CM | POA: Diagnosis not present

## 2016-02-13 DIAGNOSIS — Z90710 Acquired absence of both cervix and uterus: Secondary | ICD-10-CM | POA: Diagnosis not present

## 2016-02-13 DIAGNOSIS — E860 Dehydration: Secondary | ICD-10-CM | POA: Diagnosis present

## 2016-02-13 DIAGNOSIS — K567 Ileus, unspecified: Principal | ICD-10-CM

## 2016-02-13 DIAGNOSIS — E876 Hypokalemia: Secondary | ICD-10-CM | POA: Diagnosis present

## 2016-02-13 DIAGNOSIS — Z7982 Long term (current) use of aspirin: Secondary | ICD-10-CM | POA: Diagnosis not present

## 2016-02-13 DIAGNOSIS — I129 Hypertensive chronic kidney disease with stage 1 through stage 4 chronic kidney disease, or unspecified chronic kidney disease: Secondary | ICD-10-CM | POA: Diagnosis present

## 2016-02-13 DIAGNOSIS — K219 Gastro-esophageal reflux disease without esophagitis: Secondary | ICD-10-CM | POA: Diagnosis present

## 2016-02-13 DIAGNOSIS — R7989 Other specified abnormal findings of blood chemistry: Secondary | ICD-10-CM | POA: Diagnosis present

## 2016-02-13 DIAGNOSIS — E785 Hyperlipidemia, unspecified: Secondary | ICD-10-CM | POA: Diagnosis present

## 2016-02-13 DIAGNOSIS — Z79899 Other long term (current) drug therapy: Secondary | ICD-10-CM | POA: Diagnosis not present

## 2016-02-13 DIAGNOSIS — Z515 Encounter for palliative care: Secondary | ICD-10-CM | POA: Diagnosis not present

## 2016-02-13 DIAGNOSIS — Z8249 Family history of ischemic heart disease and other diseases of the circulatory system: Secondary | ICD-10-CM | POA: Diagnosis not present

## 2016-02-13 DIAGNOSIS — Z880 Allergy status to penicillin: Secondary | ICD-10-CM | POA: Diagnosis not present

## 2016-02-13 DIAGNOSIS — Z803 Family history of malignant neoplasm of breast: Secondary | ICD-10-CM | POA: Diagnosis not present

## 2016-02-13 DIAGNOSIS — R1110 Vomiting, unspecified: Secondary | ICD-10-CM | POA: Diagnosis not present

## 2016-02-13 DIAGNOSIS — Z9049 Acquired absence of other specified parts of digestive tract: Secondary | ICD-10-CM | POA: Diagnosis not present

## 2016-02-13 DIAGNOSIS — Z794 Long term (current) use of insulin: Secondary | ICD-10-CM | POA: Diagnosis not present

## 2016-02-13 DIAGNOSIS — E870 Hyperosmolality and hypernatremia: Secondary | ICD-10-CM | POA: Diagnosis present

## 2016-02-13 DIAGNOSIS — Z66 Do not resuscitate: Secondary | ICD-10-CM | POA: Diagnosis present

## 2016-02-13 DIAGNOSIS — I4891 Unspecified atrial fibrillation: Secondary | ICD-10-CM | POA: Diagnosis present

## 2016-02-13 DIAGNOSIS — N183 Chronic kidney disease, stage 3 (moderate): Secondary | ICD-10-CM | POA: Diagnosis present

## 2016-02-13 DIAGNOSIS — Z823 Family history of stroke: Secondary | ICD-10-CM | POA: Diagnosis not present

## 2016-02-13 DIAGNOSIS — G2581 Restless legs syndrome: Secondary | ICD-10-CM | POA: Diagnosis present

## 2016-02-13 LAB — CBC
HEMATOCRIT: 39.5 % (ref 35.0–47.0)
Hemoglobin: 13 g/dL (ref 12.0–16.0)
MCH: 28.6 pg (ref 26.0–34.0)
MCHC: 33 g/dL (ref 32.0–36.0)
MCV: 86.6 fL (ref 80.0–100.0)
PLATELETS: 151 10*3/uL (ref 150–440)
RBC: 4.55 MIL/uL (ref 3.80–5.20)
RDW: 15.2 % — ABNORMAL HIGH (ref 11.5–14.5)
WBC: 9 10*3/uL (ref 3.6–11.0)

## 2016-02-13 LAB — TROPONIN I
TROPONIN I: 0.06 ng/mL — AB (ref <0.03)
Troponin I: 0.08 ng/mL (ref <0.03)
Troponin I: 0.1 ng/mL (ref <0.03)

## 2016-02-13 LAB — BASIC METABOLIC PANEL
Anion gap: 11 (ref 5–15)
BUN: 31 mg/dL — ABNORMAL HIGH (ref 6–20)
CHLORIDE: 118 mmol/L — AB (ref 101–111)
CO2: 23 mmol/L (ref 22–32)
Calcium: 8.7 mg/dL — ABNORMAL LOW (ref 8.9–10.3)
Creatinine, Ser: 1.61 mg/dL — ABNORMAL HIGH (ref 0.44–1.00)
GFR, EST AFRICAN AMERICAN: 29 mL/min — AB (ref >60)
GFR, EST NON AFRICAN AMERICAN: 25 mL/min — AB (ref >60)
Glucose, Bld: 268 mg/dL — ABNORMAL HIGH (ref 65–99)
POTASSIUM: 2.9 mmol/L — AB (ref 3.5–5.1)
SODIUM: 152 mmol/L — AB (ref 135–145)

## 2016-02-13 LAB — GLUCOSE, CAPILLARY
GLUCOSE-CAPILLARY: 274 mg/dL — AB (ref 65–99)
Glucose-Capillary: 262 mg/dL — ABNORMAL HIGH (ref 65–99)

## 2016-02-13 MED ORDER — INSULIN ASPART 100 UNIT/ML ~~LOC~~ SOLN
0.0000 [IU] | Freq: Four times a day (QID) | SUBCUTANEOUS | Status: DC
  Administered 2016-02-13: 2 [IU] via SUBCUTANEOUS
  Administered 2016-02-13: 5 [IU] via SUBCUTANEOUS
  Filled 2016-02-13: qty 2
  Filled 2016-02-13: qty 5

## 2016-02-13 MED ORDER — ORAL CARE MOUTH RINSE
15.0000 mL | Freq: Two times a day (BID) | OROMUCOSAL | Status: DC
  Administered 2016-02-13 – 2016-02-15 (×3): 15 mL via OROMUCOSAL

## 2016-02-13 MED ORDER — SODIUM CHLORIDE 0.9 % IV SOLN
INTRAVENOUS | Status: AC
  Administered 2016-02-13: 01:00:00 via INTRAVENOUS

## 2016-02-13 MED ORDER — ASPIRIN EC 81 MG PO TBEC: 81.0000 mg | DELAYED_RELEASE_TABLET | Freq: Every day | ORAL | Status: DC

## 2016-02-13 MED ORDER — MORPHINE SULFATE (PF) 2 MG/ML IV SOLN
2.0000 mg | INTRAVENOUS | Status: DC | PRN
  Administered 2016-02-13: 2 mg via INTRAVENOUS
  Filled 2016-02-13: qty 1

## 2016-02-13 MED ORDER — DOXAZOSIN MESYLATE 4 MG PO TABS
2.0000 mg | ORAL_TABLET | Freq: Every day | ORAL | Status: DC
  Filled 2016-02-13: qty 1

## 2016-02-13 MED ORDER — CHLORHEXIDINE GLUCONATE 0.12 % MT SOLN
15.0000 mL | Freq: Two times a day (BID) | OROMUCOSAL | Status: DC
  Administered 2016-02-13 – 2016-02-15 (×5): 15 mL via OROMUCOSAL
  Filled 2016-02-13 (×5): qty 15

## 2016-02-13 MED ORDER — LOSARTAN POTASSIUM 50 MG PO TABS: 25.0000 mg | ORAL_TABLET | Freq: Every day | ORAL | Status: DC

## 2016-02-13 MED ORDER — DILTIAZEM HCL 25 MG/5ML IV SOLN
15.0000 mg | Freq: Once | INTRAVENOUS | Status: AC
  Administered 2016-02-13: 15 mg via INTRAVENOUS
  Filled 2016-02-13: qty 5

## 2016-02-13 MED ORDER — HEPARIN SODIUM (PORCINE) 5000 UNIT/ML IJ SOLN
5000.0000 [IU] | Freq: Three times a day (TID) | INTRAMUSCULAR | Status: DC
  Administered 2016-02-13: 5000 [IU] via SUBCUTANEOUS
  Filled 2016-02-13: qty 1

## 2016-02-13 MED ORDER — LORAZEPAM 2 MG/ML IJ SOLN: 1.0000 mg | INTRAMUSCULAR | Status: DC | PRN

## 2016-02-13 MED ORDER — GLYCOPYRROLATE 0.2 MG/ML IJ SOLN
0.2000 mg | INTRAMUSCULAR | Status: DC
  Filled 2016-02-13 (×3): qty 1

## 2016-02-13 MED ORDER — ACETAMINOPHEN 650 MG RE SUPP: 650.0000 mg | Freq: Four times a day (QID) | RECTAL | Status: DC | PRN

## 2016-02-13 MED ORDER — PROMETHAZINE HCL 25 MG/ML IJ SOLN
12.5000 mg | Freq: Four times a day (QID) | INTRAMUSCULAR | Status: DC | PRN
  Administered 2016-02-13: 12.5 mg via INTRAVENOUS
  Filled 2016-02-13: qty 1

## 2016-02-13 MED ORDER — METOCLOPRAMIDE HCL 5 MG/ML IJ SOLN
10.0000 mg | Freq: Three times a day (TID) | INTRAMUSCULAR | Status: DC
  Administered 2016-02-13: 10 mg via INTRAVENOUS
  Filled 2016-02-13: qty 2

## 2016-02-13 MED ORDER — LORAZEPAM BOLUS VIA INFUSION: 1.0000 mg | INTRAVENOUS | Status: DC | PRN

## 2016-02-13 MED ORDER — PANTOPRAZOLE SODIUM 40 MG PO TBEC: 40.0000 mg | DELAYED_RELEASE_TABLET | Freq: Every day | ORAL | Status: DC

## 2016-02-13 MED ORDER — HALOPERIDOL LACTATE 5 MG/ML IJ SOLN: 2.0000 mg | Freq: Four times a day (QID) | INTRAMUSCULAR | Status: DC | PRN

## 2016-02-13 MED ORDER — GLYCOPYRROLATE 0.2 MG/ML IJ SOLN
0.4000 mg | INTRAMUSCULAR | Status: DC
  Administered 2016-02-13 (×3): 0.4 mg via INTRAVENOUS
  Filled 2016-02-13 (×4): qty 2

## 2016-02-13 MED ORDER — MORPHINE BOLUS VIA INFUSION: 2.0000 mg | INTRAVENOUS | Status: DC | PRN

## 2016-02-13 MED ORDER — INFLUENZA VAC SPLIT QUAD 0.5 ML IM SUSY
0.5000 mL | PREFILLED_SYRINGE | INTRAMUSCULAR | Status: DC
Start: 2016-02-14 — End: 2016-02-16

## 2016-02-13 MED ORDER — DILTIAZEM HCL 25 MG/5ML IV SOLN
10.0000 mg | INTRAVENOUS | Status: AC
  Administered 2016-02-13: 10 mg via INTRAVENOUS
  Filled 2016-02-13: qty 5

## 2016-02-13 MED ORDER — ROPINIROLE HCL 1 MG PO TABS
0.5000 mg | ORAL_TABLET | Freq: Two times a day (BID) | ORAL | Status: DC
  Filled 2016-02-13: qty 1

## 2016-02-13 MED ORDER — SODIUM CHLORIDE 0.9 % IV SOLN
20.0000 ug/h | INTRAVENOUS | Status: DC
  Administered 2016-02-13: 20 ug/h via INTRAVENOUS
  Filled 2016-02-13: qty 1

## 2016-02-13 MED ORDER — GLYCOPYRROLATE 0.2 MG/ML IJ SOLN: 0.2000 mg | INTRAMUSCULAR | Status: DC

## 2016-02-13 MED ORDER — POTASSIUM CHLORIDE 10 MEQ/100ML IV SOLN
10.0000 meq | INTRAVENOUS | Status: DC
  Filled 2016-02-13 (×4): qty 100

## 2016-02-13 MED ORDER — METOCLOPRAMIDE HCL 5 MG/ML IJ SOLN
5.0000 mg | Freq: Three times a day (TID) | INTRAMUSCULAR | Status: DC
  Administered 2016-02-13 (×2): 5 mg via INTRAVENOUS
  Filled 2016-02-13 (×2): qty 2

## 2016-02-13 MED ORDER — ACETAMINOPHEN 325 MG PO TABS: 650.0000 mg | ORAL_TABLET | Freq: Four times a day (QID) | ORAL | Status: DC | PRN

## 2016-02-13 NOTE — Care Management (Signed)
Per Dr. Shah patient to be made comfort care.  [name] with [facility] notified of plan. 

## 2016-02-13 NOTE — Progress Notes (Signed)
The nurse placed in an order for a chaplin upon the patient's request. I visited the patient and offered her and her family prayer and spiritual support that gave her comfort and great encouragement.

## 2016-02-13 NOTE — Care Management (Signed)
Per Dr. Sherryll BurgerShah patient to be made comfort care.  Barbara CowerJason with [facility] notified of plan.

## 2016-02-13 NOTE — Progress Notes (Signed)
[facility] paged on [phone]; [phone]; [phone] and [phone]. For HR sustained 130s Awaiting call back. Windy Carinaurner,Luie Laneve K, RN 3:36 AM 02/13/2016

## 2016-02-13 NOTE — Progress Notes (Signed)
Cardiac rhythm changed to afib; HR 120-130s; elevated when spitting up 2' to nausea; [facility] paged multiple times and Supervisor, Alecia notified. Windy Carinaurner,Haris Baack K, RN 4:01 AM 02/13/2016

## 2016-02-13 NOTE — Progress Notes (Signed)
Chaplain and Intern rounded the unit to provide a compassionate presence and support to the patient. Visitation occurred with the patient telling her story and concluded with a prayer from the Intern. Patient appeared to appreciate the support and visit.  Jefm PettyChaplain Johnryan Sao [phone]

## 2016-02-13 NOTE — Progress Notes (Signed)
Nutrition Brief Note  Chart reviewed secondary to Malnutrition Screening Tool. Pt now transitioning to comfort care.  No further nutrition interventions warranted at this time.  Please re-consult as needed.   Ileane Sando B. Freida BusmanAllen, RD, LDN [phone] (pager) Weekend/On-Call pager [phone])

## 2016-02-13 NOTE — Progress Notes (Signed)
Drs. Anne HahnWillis and Sheryle Hailiamond paged for HR 130-150; awaiting call back. Windy Carinaurner,Ranger Petrich K, RN 3:28 AM 02/13/2016

## 2016-02-13 NOTE — Progress Notes (Signed)
Dr. Sheryle Hailiamond notified of cardiac rhythm change and sustained HR; new orders written to give Zofran now and give daily Cardura now. Antoinne Spadaccini K, RN4:04 AM 02/13/2016

## 2016-02-13 NOTE — Consult Note (Signed)
Consultation Note Date: 02/13/2016   Patient Name: Morgan Howard  DOB: September 15, 1914  MRN: [id number]  Age / Sex: 82 y.o., female  PCP: Tracie Harrier, MD Referring Physician: Max Sane, MD  Reason for Consultation: Establishing goals of care  HPI/Patient Profile: 82 y.o. female  with past medical history of diabetes, HLD, HTN, psoriasis, atrial fibrillation, GERD, CKD, restless leg syndrome and recent admission with ileus admitted on 02/18/2016 with nausea, vomiting and recurrent ileus.   Clinical Assessment and Goals of Care: I met today with Morgan Howard along with her caregiver at bedside. Also discussed with her [place]. They confirm that Morgan Howard has had a good life and has communicated to them that she is ready to die when her time comes. She has lived through many losses of friends and family over the years. Continues to not desire aggressive care or NGT. HCPOA open to hospice care but he continues to vomit almost continuously.   Morgan Howard tells me that she is hoping "God will heal me." I tell her that we will do all that we can to make her feel better but we may not be able to resolve her main health problem. I asked her how she felt if we could not get her better and she were to die. She tells me that she is not afraid and is accepting of end of life.   HCPOA [place]   Code Status/[facility] Care Planning:  DNR   Symptom Management:   Nausea/Vomiting:  Glycopyrrolate 0.4 mg every 4 hours x 24 hours and then 0.2 mg every 4 hours.   Haldol 2 mg every 6 hours prn.   Continue Reglan, Zofran, Phenergan as ordered.   Starting Octreotide 20 mcg/hr. May increase every 24 hours up to 50 mcg/hr max to control symptoms.  Palliative Prophylaxis:   Aspiration, Delirium Protocol, Frequent Pain Assessment and Oral Care  Additional Recommendations (Limitations, Scope,  Preferences):  Full Comfort Care  Psycho-social/Spiritual:   Desire for further Chaplaincy support:yes  Additional Recommendations: Caregiving  Support/Resources, Education on Hospice and Grief/Bereavement Support  Prognosis:   Poor prognosis: Could be hours to days.   Discharge Planning: Hospital death vs hospice facility.      Primary Diagnoses: Present on Admission: . Ileus ([place]) . GERD (gastroesophageal reflux disease) . Dehydration . RLS (restless legs syndrome) . A-fib ([place]) . CKD (chronic kidney disease)   I have reviewed the medical record, interviewed the patient and family, and examined the patient. The following aspects are pertinent.  Past Medical History:  Diagnosis Date  . Diabetes ([place])   . HLD (hyperlipidemia)   . HTN (hypertension)   . Psoriasis    Social History   Social History  . Marital status: Single    Spouse name: N/A  . Number of children: N/A  . Years of education: N/A   Social History Main Topics  . Smoking status: Never Smoker  . Smokeless tobacco: Never Used  . Alcohol use No  . Drug use: Unknown  .  Sexual activity: Not Asked   Other Topics Concern  . None   Social History Narrative  . None   Family History  Problem Relation Age of Onset  . Stroke Mother   . Heart attack Father   . Breast cancer Sister    Scheduled Meds: . chlorhexidine  15 mL Mouth Rinse BID  . [START ON 02/14/2016] Influenza vac split quadrivalent PF  0.5 mL Intramuscular Tomorrow-1000  . mouth rinse  15 mL Mouth Rinse q12n4p  . metoCLOPramide (REGLAN) injection  5 mg Intravenous Q8H   Continuous Infusions:  PRN Meds:.LORazepam, morphine injection, ondansetron **OR** ondansetron (ZOFRAN) IV, promethazine Medications Prior to Admission:  Prior to Admission medications   Medication Sig Start Date End Date Taking? Authorizing Provider  acetaminophen (TYLENOL) 500 MG tablet Take 500 mg by mouth daily.   Yes Historical Provider, MD  aspirin EC 81 MG  tablet Take 81 mg by mouth daily.   Yes Historical Provider, MD  docusate sodium (COLACE) 50 MG capsule Take 50 mg by mouth daily.   Yes Historical Provider, MD  doxazosin (CARDURA) 2 MG tablet Take 2 mg by mouth daily.   Yes Historical Provider, MD  ferrous sulfate 325 (65 FE) MG tablet Take 325 mg by mouth daily with breakfast.   Yes Historical Provider, MD  Insulin Glargine (LANTUS SOLOSTAR) 100 UNIT/ML Solostar Pen Inject 16 Units into the skin at bedtime. Patient taking differently: Inject 26 Units into the skin at bedtime.  01/02/16  Yes Fritzi Mandes, MD  losartan (COZAAR) 25 MG tablet Take 25 mg by mouth daily.   Yes Historical Provider, MD  omeprazole (PRILOSEC) 20 MG capsule Take 20 mg by mouth daily.   Yes Historical Provider, MD  rOPINIRole (REQUIP) 0.5 MG tablet Take 0.5 mg by mouth 2 (two) times daily.   Yes Historical Provider, MD  sitaGLIPtin (JANUVIA) 25 MG tablet Take 25 mg by mouth daily.   Yes Historical Provider, MD  vitamin B-12 (CYANOCOBALAMIN) 1000 MCG tablet Take 1,000 mcg by mouth daily.   Yes Historical Provider, MD  zolpidem (AMBIEN) 5 MG tablet Take 0.5 tablets (2.5 mg total) by mouth at bedtime as needed for sleep. 02/11/16  Yes Fritzi Mandes, MD   Allergies  Allergen Reactions  . Biaxin [Clarithromycin]   . Lipitor [Atorvastatin]   . Lovastatin   . Nabumetone   . Penicillins     Has patient had a PCN reaction causing immediate rash, facial/tongue/throat swelling, SOB or lightheadedness with hypotension: No Has patient had a PCN reaction causing severe rash involving mucus membranes or skin necrosis: No Has patient had a PCN reaction that required hospitalization No Has patient had a PCN reaction occurring within the last 10 years: No If all of the above answers are "NO", then may proceed with Cephalosporin use.  Valerie Salts   . Zantac [Ranitidine Hcl]    Review of Systems  Constitutional: Positive for appetite change.  Gastrointestinal: Positive for constipation,  nausea and vomiting.  Musculoskeletal: Positive for arthralgias.  Neurological: Positive for weakness.    Physical Exam  Constitutional: She is oriented to person, place, and time.  HENT:  Head: Normocephalic and atraumatic.  Cardiovascular: Tachycardia present.   Pulmonary/Chest: Effort normal. No accessory muscle usage. No tachypnea. No respiratory distress.  Abdominal: She exhibits distension.  Neurological: She is alert and oriented to person, place, and time.  Nursing note and vitals reviewed.   Vital Signs: BP (!) 116/48   Pulse (!) 102   Temp 98  F (36.7 C) (Oral)   Resp 20   Ht 4' 11"  (1.499 m)   Wt 52.2 kg (115 lb 1.6 oz)   SpO2 97%   BMI 23.25 kg/m  Pain Assessment: 0-10 POSS *See Group Information*: 1-Acceptable,Awake and alert Pain Score: 7    SpO2: SpO2: 97 % O2 Device:SpO2: 97 % O2 Flow Rate: .   IO: Intake/output summary:  Intake/Output Summary (Last 24 hours) at 02/13/16 1451 Last data filed at 02/13/16 1042  Gross per 24 hour  Intake           1842.5 ml  Output                0 ml  Net           1842.5 ml    LBM: Last BM Date: 01/26/2016 Baseline Weight: Weight: 52.2 kg (115 lb) Most recent weight: Weight: 52.2 kg (115 lb 1.6 oz)     Palliative Assessment/Data:     Time In: 1530 Time Out: 1630 Time Total: 63mn Greater than 50%  of this time was spent counseling and coordinating care related to the above assessment and plan.  Signed by: PPershing Proud NP   Please contact Palliative Medicine Team phone at [phone] questions and concerns.  For individual provider: See AShea Evans

## 2016-02-13 NOTE — Progress Notes (Signed)
[facility]  Patient Status: Active  [facility] is providing the following services: SN/PT  If patient discharges after hours, please call [phone].   Morgan GerlachJason E Howard 02/13/2016, 10:00 AM

## 2016-02-13 NOTE — Progress Notes (Signed)
Central telemetry called to say patient's HR in 140s; found patient with restless legs, moving legs up/down in bed; c/o not being able to sleep;  Encouraged patient to take deep breaths and try to relax; able to deep breathe. Windy Carinaurner,Tinamarie Przybylski K, RN 3:13 AM 02/13/2016

## 2016-02-13 NOTE — Progress Notes (Signed)
[employer] - [place] at [facility]   PATIENT NAME: Morgan Howard    MR#:  [id number]  DATE OF BIRTH:  1914-11-22  SUBJECTIVE:  Readmitted for same - vomiting greenish material, refusing NGT, All night HR in 120-130s and still same. REVIEW OF SYSTEMS:    Review of Systems  Constitutional: Positive for malaise/fatigue. Negative for chills and fever.  HENT: Negative for congestion and tinnitus.   Eyes: Negative for blurred vision and double vision.  Respiratory: Negative for cough, shortness of breath and wheezing.   Cardiovascular: Negative for chest pain, orthopnea and PND.  Gastrointestinal: Positive for abdominal pain, nausea and vomiting. Negative for diarrhea.  Genitourinary: Negative for dysuria and hematuria.  Neurological: Positive for weakness. Negative for dizziness, sensory change and focal weakness.  All other systems reviewed and are negative.   Nutrition: NPO Tolerating Diet: No DRUG ALLERGIES:   Allergies  Allergen Reactions  . Biaxin [Clarithromycin]   . Lipitor [Atorvastatin]   . Lovastatin   . Nabumetone   . Penicillins     Has patient had a PCN reaction causing immediate rash, facial/tongue/throat swelling, SOB or lightheadedness with hypotension: No Has patient had a PCN reaction causing severe rash involving mucus membranes or skin necrosis: No Has patient had a PCN reaction that required hospitalization No Has patient had a PCN reaction occurring within the last 10 years: No If all of the above answers are "NO", then may proceed with Cephalosporin use.  Laurita Quint. Psoralea   . Zantac [Ranitidine Hcl]    VITALS:  Blood pressure (!) 116/48, pulse (!) 102, temperature 98 F (36.7 C), temperature source Oral, resp. rate 20, height 4\' 11"  (1.499 m), weight 52.2 kg (115 lb 1.6 oz), SpO2 97 %.  PHYSICAL EXAMINATION:  Physical Exam  Constitutional: She is oriented to person, place, and time. She appears to be writhing in pain and malnourished. She  appears unhealthy. She appears cachectic. She appears toxic. She has a sickly appearance. She appears distressed.  HENT:  Head: Normocephalic and atraumatic.  Eyes: Conjunctivae and EOM are normal. Pupils are equal, round, and reactive to light.  Neck: Normal range of motion. Neck supple. No tracheal deviation present. No thyromegaly present.  Cardiovascular: Normal rate, regular rhythm and normal heart sounds.   Pulmonary/Chest: Effort normal and breath sounds normal. No respiratory distress. She has no wheezes. She exhibits no tenderness.  Abdominal: Soft. She exhibits distension. Bowel sounds are hypoactive. There is generalized tenderness.  Musculoskeletal: Normal range of motion.  Neurological: She is alert and oriented to person, place, and time. No cranial nerve deficit.  Skin: Skin is warm and dry. No rash noted.  Psychiatric: Mood and affect normal.   LABORATORY PANEL:   CBC  Recent Labs Lab 02/13/16 0559  WBC 9.0  HGB 13.0  HCT 39.5  PLT 151   ------------------------------------------------------------------------------------------------------------------  Chemistries   Recent Labs Lab Sep 28, 2015 1812 02/13/16 0559  NA 148* 152*  K 3.6 2.9*  CL 114* 118*  CO2 16* 23  GLUCOSE 363* 268*  BUN 34* 31*  CREATININE 1.52* 1.61*  CALCIUM 9.1 8.7*  AST 21  --   ALT 26  --   ALKPHOS 76  --   BILITOT 0.8  --    ------------------------------------------------------------------------------------------------------------------  Cardiac Enzymes  Recent Labs Lab 02/13/16 1217  TROPONINI 0.10*   ------------------------------------------------------------------------------------------------------------------  RADIOLOGY:  Ct Head Wo Contrast  Result Date: 25-Apr-2016 CLINICAL DATA:  Acute nausea and vomiting, cramping and spasm, concern for stroke. EXAM: CT  HEAD WITHOUT CONTRAST TECHNIQUE: Contiguous axial images were obtained from the base of the skull through the  vertex without intravenous contrast. COMPARISON:  01/01/2016. FINDINGS: Brain: No evidence for acute infarction, hemorrhage, mass lesion, hydrocephalus, or extra-axial fluid. Moderate atrophy. Chronic microvascular ischemic change. Vascular: Vascular calcification without hyperdense vessel. Skull: Normal. Negative for fracture or focal lesion. Sinuses/Orbits: No acute finding. Other: None. Compared with priors, similar appearance. IMPRESSION: Atrophy and small vessel disease.  No acute intracranial findings. Electronically Signed   By: Elsie Stain M.D.   On: 01/24/2016 20:27   Dg Abdomen Acute W/chest  Result Date: 02/19/2016 CLINICAL DATA:  Vomiting several times last night. EXAM: DG ABDOMEN ACUTE W/ 1V CHEST COMPARISON:  02/10/2016 FINDINGS: The upright chest x-ray is normal. The cardiac silhouette, mediastinal and hilar contours are within normal limits. There is tortuosity and calcification of the thoracic aorta. The lungs are clear. Stable degenerative changes involving both shoulders with multiple loose calcified bodies noted on the right. Two views of the abdomen demonstrate residual contrast in the colon from a prior recent CT scan. No dilated small bowel loops to suggest obstruction. No free air. The bony structures are intact. IMPRESSION: No acute cardiopulmonary findings. No plain film findings for an acute abdominal process. Electronically Signed   By: Rudie Meyer M.D.   On: 01/29/2016 19:24   ASSESSMENT AND PLAN:  82 year old female with past medical history of hypertension, hyperlipidemia, diabetes, history of psoriasis who presented to the hospital readmitted (D/Ced 2 days ago) with intractable nausea and vomiting.  1. Intractable nausea and vomiting- likely secondary to a /Ileus - patient refusing NG tube, antiemetics prn - had long d/w family/caregiver (her HCPOA and his wife) who all agrees for comfort care and that's what patient requests as well - will order morphine and ativan to  keep her comfortable  2. Hypernatremia/Hypokalemia: no Labs, comfort care only  3. HTN - comfort care only  4. GERD - comfort care only  5. Restless Leg syndrome - comfort care only  6. A-fib - comfort care only, rate was in 130-140s  7. CKD (chronic kidney disease) 3- comfort care only  8. DM - comfort care only   Spoke with caregiver in the room and her HCPOA via phone   All the records are reviewed and case discussed with Care Management/Social Worker. Management plans discussed with the patient, family and they are in agreement.  CODE STATUS: DNR, palliative care c/s, if still alive on Monday, consider Hospice home placement   TOTAL TIME TAKING CARE OF THIS PATIENT: 30 minutes.   POSSIBLE D/C to Hospice Home on Monday if still alive.    [facility], Jaylia Pettus M.D on 02/13/2016 at 1:38 PM  Between 7am to 6pm - Pager - [phone]  After 6pm go to [email or web address] - Scientist, research (life sciences) [place] [facility]  Office  [phone]  CC: Primary care physician; Barbette Reichmann, MD

## 2016-02-13 NOTE — Progress Notes (Signed)
Dr. Sheryle Hailiamond called back to resind Cardura order. Ordered Diltazem IVP X1; Windy Carinaurner,Astaria Nanez K, RN 02/13/2016 4:29 AM

## 2016-02-14 LAB — HEMOGLOBIN A1C
Hgb A1c MFr Bld: 8.1 % — ABNORMAL HIGH (ref 4.8–5.6)
MEAN PLASMA GLUCOSE: 186 mg/dL

## 2016-02-14 MED ORDER — ONDANSETRON 4 MG PO TBDP
4.0000 mg | ORAL_TABLET | ORAL | Status: DC | PRN
  Administered 2016-02-14: 4 mg via ORAL
  Filled 2016-02-14: qty 1

## 2016-02-14 MED ORDER — OXYCODONE HCL 5 MG/5ML PO SOLN
2.0000 mg | ORAL | Status: DC | PRN
  Administered 2016-02-14 – 2016-02-16 (×14): 2 mg via ORAL
  Filled 2016-02-14 (×15): qty 5

## 2016-02-14 MED ORDER — ONDANSETRON 4 MG PO TBDP: 4.0000 mg | ORAL_TABLET | Freq: Three times a day (TID) | ORAL | Status: DC | PRN

## 2016-02-14 NOTE — Progress Notes (Signed)
Pt has no IV access, all IV medications were D/C'd per MD order.

## 2016-02-14 NOTE — Progress Notes (Signed)
Notified MD loss of IV access. Orders adjusted for pain med and nausea meds.

## 2016-02-14 NOTE — Progress Notes (Signed)
Subjective: Complains of a little congestion. No pain  Objective: Vital signs in last 24 hours: Pulse Rate:  [102] 102 (09/22 1046) Resp:  [18] 18 (09/23 0846) BP: (116)/(48) 116/48 (09/22 1046) Weight change:  Last BM Date: 03-14-2016  Intake/Output from previous day: 09/22 0701 - 09/23 0700 In: 723.8 [I.V.:723.8] Out: -  Intake/Output this shift: No intake/output data recorded.  General appearance: alert and cooperative Resp: Mild rhonci Cardio: regular rate and rhythm, S1, S2 normal, no murmur, click, rub or gallop  Lab Results:  Recent Labs  03-14-2016 1812 02/13/16 0559  WBC 8.1 9.0  HGB 13.6 13.0  HCT 42.8 39.5  PLT 164 151   BMET  Recent Labs  03-14-2016 1812 02/13/16 0559  NA 148* 152*  K 3.6 2.9*  CL 114* 118*  CO2 16* 23  GLUCOSE 363* 268*  BUN 34* 31*  CREATININE 1.52* 1.61*  CALCIUM 9.1 8.7*    Studies/Results: Ct Head Wo Contrast  Result Date: 2015/10/05 CLINICAL DATA:  Acute nausea and vomiting, cramping and spasm, concern for stroke. EXAM: CT HEAD WITHOUT CONTRAST TECHNIQUE: Contiguous axial images were obtained from the base of the skull through the vertex without intravenous contrast. COMPARISON:  01/01/2016. FINDINGS: Brain: No evidence for acute infarction, hemorrhage, mass lesion, hydrocephalus, or extra-axial fluid. Moderate atrophy. Chronic microvascular ischemic change. Vascular: Vascular calcification without hyperdense vessel. Skull: Normal. Negative for fracture or focal lesion. Sinuses/Orbits: No acute finding. Other: None. Compared with priors, similar appearance. IMPRESSION: Atrophy and small vessel disease.  No acute intracranial findings. Electronically Signed   By: Elsie StainJohn T Curnes M.D.   On: 02017/05/14 20:27   Dg Abdomen Acute W/chest  Result Date: 2015/10/05 CLINICAL DATA:  Vomiting several times last night. EXAM: DG ABDOMEN ACUTE W/ 1V CHEST COMPARISON:  02/10/2016 FINDINGS: The upright chest x-ray is normal. The cardiac silhouette,  mediastinal and hilar contours are within normal limits. There is tortuosity and calcification of the thoracic aorta. The lungs are clear. Stable degenerative changes involving both shoulders with multiple loose calcified bodies noted on the right. Two views of the abdomen demonstrate residual contrast in the colon from a prior recent CT scan. No dilated small bowel loops to suggest obstruction. No free air. The bony structures are intact. IMPRESSION: No acute cardiopulmonary findings. No plain film findings for an acute abdominal process. Electronically Signed   By: Rudie MeyerP.  Gallerani M.D.   On: 02017/05/14 19:24    Medications: I have reviewed the patient's current medications.  Assessment/Plan: 1. Ileus: Patient is on comfort and care only. No abdominal pain.  2. N/V: Not actively vomiting currently.  Disposition: On comfort and care measures.  Time spent; 10 min  LOS: 1 day   Gracelyn NurseJohnston,  John D 02/14/2016, 10:29 AM

## 2016-02-15 NOTE — Plan of Care (Signed)
Problem: Activity: Goal: Risk for activity intolerance will decrease Outcome: Not Progressing Comfort care, position as desired  Comments: Comfort care, position as desired

## 2016-02-15 NOTE — Progress Notes (Signed)
Subjective: Awake with many family members present.  Objective: Vital signs in last 24 hours:   Weight change:  Last BM Date: 02/06/2016  Intake/Output from previous day: No intake/output data recorded. Intake/Output this shift: No intake/output data recorded.  General appearance: no distress  Lab Results:  Recent Labs  01/29/2016 1812 02/13/16 0559  WBC 8.1 9.0  HGB 13.6 13.0  HCT 42.8 39.5  PLT 164 151   BMET  Recent Labs  02/14/2016 1812 02/13/16 0559  NA 148* 152*  K 3.6 2.9*  CL 114* 118*  CO2 16* 23  GLUCOSE 363* 268*  BUN 34* 31*  CREATININE 1.52* 1.61*  CALCIUM 9.1 8.7*    Studies/Results: No results found.  Medications: I have reviewed the patient's current medications.  Assessment/Plan: 1. Ileus: Patient is on comfort and care only. No abdominal pain.  2. N/V: Vomited earlier today but appears comfortable now.  LOS: 2 days   Gracelyn NurseJohnston,  Alivea Gladson D 02/15/2016, 2:18 PM

## 2016-02-22 NOTE — Progress Notes (Signed)
Patient respiration rate is 9  and labored. Heart rate is 68. Patient laying in bed with eyes close. Looks comfortable. At this time.Waiting for family to come by.

## 2016-02-22 NOTE — Progress Notes (Signed)
Patient ID: Morgan Howard, female   DOB: 1914/09/25, 82 y.o.   MRN: [id number] Called at 8:30 by nursing to inform me of patient expiration.   On my exam patient was found unresponsive to verbal, tactile and painful stimuli. There is no corneal or gag reflex. Pupils are fixed and dilated. There are no pulses, heart tones or breath sounds. Patient was pronounced by nursing at 8:29 AM. Patient's family was present in the room. Nursing manager and funeral home were made aware.

## 2016-02-22 NOTE — Discharge Summary (Signed)
[facility] - [place] at [facility]   PATIENT NAME: Morgan Howard    MR#:  [id number]  DATE OF BIRTH:  1914/12/17  DATE OF ADMISSION:  02/11/2016  DATE OF DEATH: 12/13/15  ADMITTING PHYSICIAN: Oralia Manisavid Willis, MD  DATE OF DISCHARGE: 12/13/15 12:14 PM  PRIMARY CARE PHYSICIAN: Barbette ReichmannHANDE,VISHWANATH, MD   ADMISSION DIAGNOSIS:  Elevated troponin I level [R79.89] Intractable vomiting with nausea, vomiting of unspecified type [R11.10] DISCHARGE DIAGNOSIS:  Principal Problem:   Ileus (HCC) Active Problems:   Diabetes (HCC)   GERD (gastroesophageal reflux disease)   Dehydration   RLS (restless legs syndrome)   A-fib (HCC)   CKD (chronic kidney disease)   Palliative care encounter   Uncontrollable vomiting  SECONDARY DIAGNOSIS:   Past Medical History:  Diagnosis Date  . Diabetes (HCC)   . HLD (hyperlipidemia)   . HTN (hypertension)   . Psoriasis    HOSPITAL COURSE:  Patient was admitted on 02/20/2016 with ileus, dehydration, atrial fibrillation. She was made NPO, provided IV fluid hydration, pain control, insulin sliding scale coverage and GI consult was requested.  She was made comfort care by HCP and expiredon Apr 20, 2016 at 8:29AM (please see my last progress note).     DISCHARGE CONDITIONS:  Expired.   CONSULTS OBTAINED:   DRUG ALLERGIES:   Allergies  Allergen Reactions  . Biaxin [Clarithromycin]   . Lipitor [Atorvastatin]   . Lovastatin   . Nabumetone   . Penicillins     Has patient had a PCN reaction causing immediate rash, facial/tongue/throat swelling, SOB or lightheadedness with hypotension: No Has patient had a PCN reaction causing severe rash involving mucus membranes or skin necrosis: No Has patient had a PCN reaction that required hospitalization No Has patient had a PCN reaction occurring within the last 10 years: No If all of the above answers are "NO", then may proceed with Cephalosporin use.  Laurita Quint. Psoralea   . Zantac [Ranitidine Hcl]     DISCHARGE MEDICATIONS:     Medication List    ASK your doctor about these medications   acetaminophen 500 MG tablet Commonly known as:  TYLENOL Take 500 mg by mouth daily.   aspirin EC 81 MG tablet Take 81 mg by mouth daily.   docusate sodium 50 MG capsule Commonly known as:  COLACE Take 50 mg by mouth daily.   doxazosin 2 MG tablet Commonly known as:  CARDURA Take 2 mg by mouth daily.   ferrous sulfate 325 (65 FE) MG tablet Take 325 mg by mouth daily with breakfast.   Insulin Glargine 100 UNIT/ML Solostar Pen Commonly known as:  LANTUS SOLOSTAR Inject 16 Units into the skin at bedtime.   losartan 25 MG tablet Commonly known as:  COZAAR Take 25 mg by mouth daily.   omeprazole 20 MG capsule Commonly known as:  PRILOSEC Take 20 mg by mouth daily.   rOPINIRole 0.5 MG tablet Commonly known as:  REQUIP Take 0.5 mg by mouth 2 (two) times daily.   sitaGLIPtin 25 MG tablet Commonly known as:  JANUVIA Take 25 mg by mouth daily.   vitamin B-12 1000 MCG tablet Commonly known as:  CYANOCOBALAMIN Take 1,000 mcg by mouth daily.   zolpidem 5 MG tablet Commonly known as:  AMBIEN Take 0.5 tablets (2.5 mg total) by mouth at bedtime as needed for sleep.        DISCHARGE INSTRUCTIONS:  None DIET:  N/A DISCHARGE CONDITION:  N/A. Expired ACTIVITY:  N/A. Expired. OXYGEN:  N/A.  Expired DISCHARGE LOCATION:  N/A. Expired  DATA REVIEW:   CBC  Recent Labs Lab 02/13/16 0559  WBC 9.0  HGB 13.0  HCT 39.5  PLT 151    Chemistries   Recent Labs Lab 02-18-2016 1812 02/13/16 0559  NA 148* 152*  K 3.6 2.9*  CL 114* 118*  CO2 16* 23  GLUCOSE 363* 268*  BUN 34* 31*  CREATININE 1.52* 1.61*  CALCIUM 9.1 8.7*  AST 21  --   ALT 26  --   ALKPHOS 76  --   BILITOT 0.8  --      Microbiology Results  Results for orders placed or performed in visit on 12/24/12  Urine culture     Status: None   Collection Time: 12/23/12  2:24 PM  Result Value Ref Range  Status   Micro Text Report   Final       SOURCE: CLEAN CATCH    COMMENT                   MIXED BACTERIAL ORGANISMS   COMMENT                   RESULTS SUGGESTIVE OF CONTAMINATION   ANTIBIOTIC                                                        RADIOLOGY:  No results found.   Management plans discussed with the patient, family and they are in agreement.  CODE STATUS:  Code Status History    Date Active Date Inactive Code Status Order ID Comments User Context   02/13/2016 10:51 AM 01/25/2016  3:19 PM DNR [id number]  Delfino Lovett, MD Inpatient   02/13/2016 12:16 AM 02/13/2016 10:51 AM DNR [id number]  Oralia Manis, MD Inpatient   02/05/2016  2:06 AM 02/11/2016  5:33 PM Full Code [id number]  Tonye Royalty, DO Inpatient   01/02/2016  1:25 AM 01/02/2016  7:34 PM DNR [id number]  Oralia Manis, MD Inpatient    Questions for Most Recent Historical Code Status (Order [id number])    Question Answer Comment   In the event of cardiac or respiratory ARREST Do not call a "code blue"    In the event of cardiac or respiratory ARREST Do not perform Intubation, CPR, defibrillation or ACLS    In the event of cardiac or respiratory ARREST Use medication by any route, position, wound care, and other measures to relive pain and suffering. May use oxygen, suction and manual treatment of airway obstruction as needed for comfort.         Advance Directive Documentation   Flowsheet Row Most Recent Value  Type of Advance Directive  Healthcare Power of Attorney, Living will  Pre-existing out of facility DNR order (yellow form or pink MOST form)  No data  "MOST" Form in Place?  No data      TOTAL TIME TAKING CARE OF THIS PATIENT: 25 minutes.    Tonye Royalty M.D on 02/18/2016 at 4:11 PM  Between 7am to 6pm - Pager - [phone]  After 6pm go to [email or web address] - Social research officer, government  [facility] [place] [facility]  Office  [phone]  CC: Primary care physician; Barbette Reichmann,  MD   Note: This dictation was prepared with Dragon dictation along with smaller phrase technology. Any transcriptional errors that result  from this process are unintentional.

## 2016-02-22 NOTE — Progress Notes (Signed)
Pt more at rest, less restless, unresponsive verbally, POA aware and on his way back to the hospital.

## 2016-02-22 NOTE — Progress Notes (Signed)
Patient is pronounce dead at 8:29. Patient was unresponsive, no respiration, no apical pulse  noted and pupils are fixed. Peripheral extremities are cool to touch. Families at bedside. Prime doc made aware.

## 2016-02-22 DEATH — deceased

## 2018-05-13 IMAGING — DX DG CHEST 1V PORT
1 series · 1 of 1 positions shown · non-contrast
Comparison: None.

CLINICAL DATA: Decreased blood sugar today.

EXAM:
PORTABLE CHEST 1 VIEW

[chest ap]
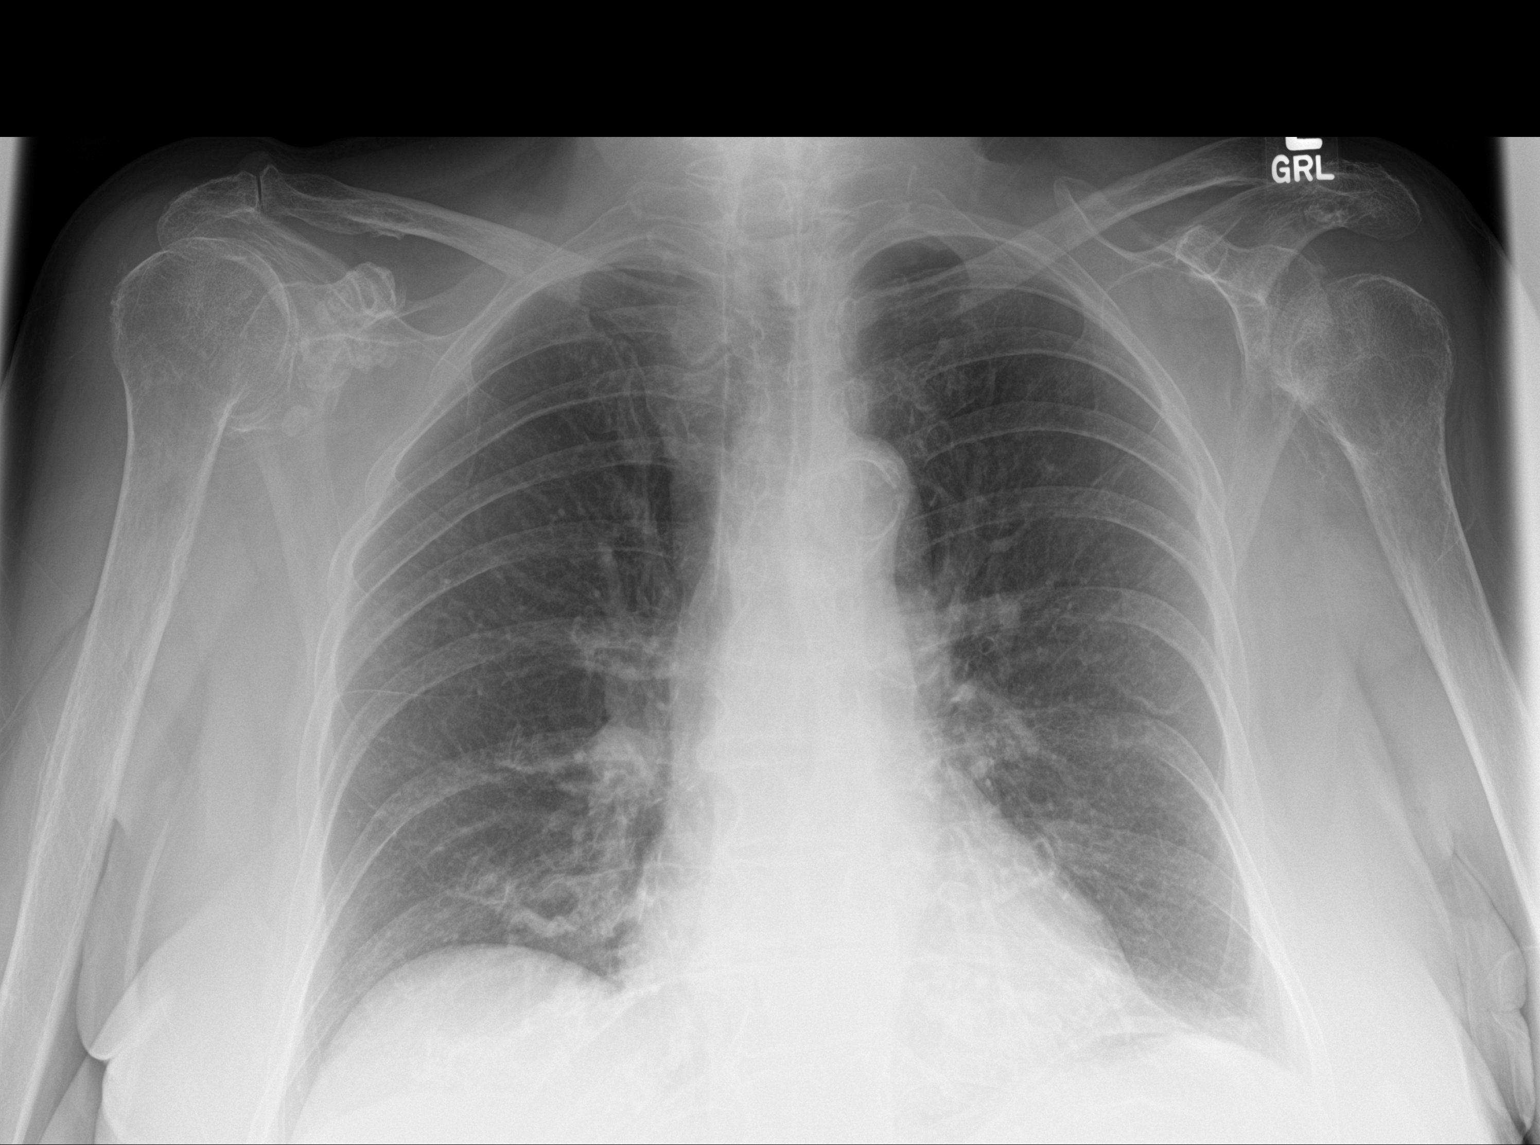

[1 of 1 positions shown; findings below may reference images not displayed]

FINDINGS: Lungs are clear. Heart size is normal. No pneumothorax or pleural
effusion. Aortic atherosclerosis is noted.
IMPRESSION: No acute disease.

Atherosclerosis.

## 2018-05-13 IMAGING — CT CT HEAD W/O CM
3 series · 15 of 45 positions shown, 18 images · non-contrast
Comparison: Head CT scan 12/21/2012.

CLINICAL DATA: Vomiting for 1 day. The patient does not feel
normal. Hypoglycemia.

EXAM:
CT HEAD WITHOUT CONTRAST
TECHNIQUE: Contiguous axial images were obtained from the base of the skull
through the vertex without intravenous contrast.

[Series 2: head wo · axial · 0.41mm/px · z∈[-49,+66]mm · 9 of 28 slices shown, 12 images]
[im 3/28  brain]
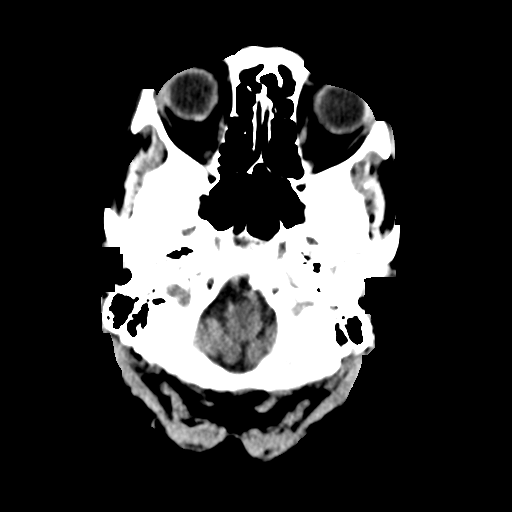
[im 3/28  bone]
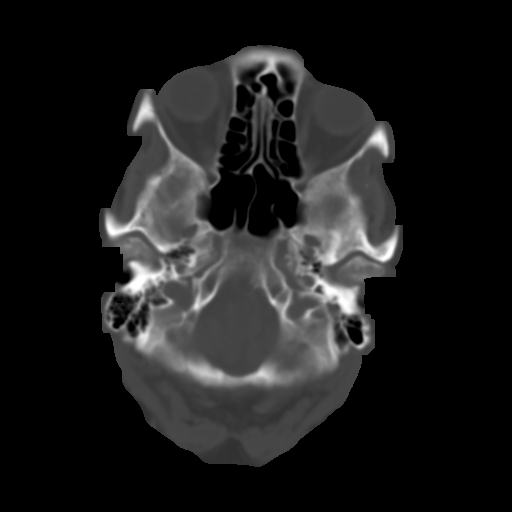
[im 6/28  brain]
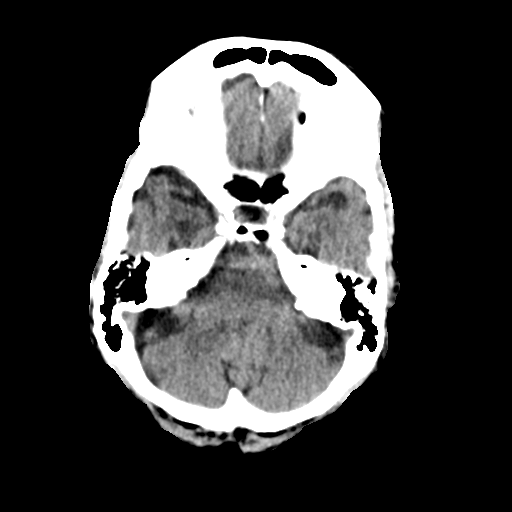
[im 9/28  brain]
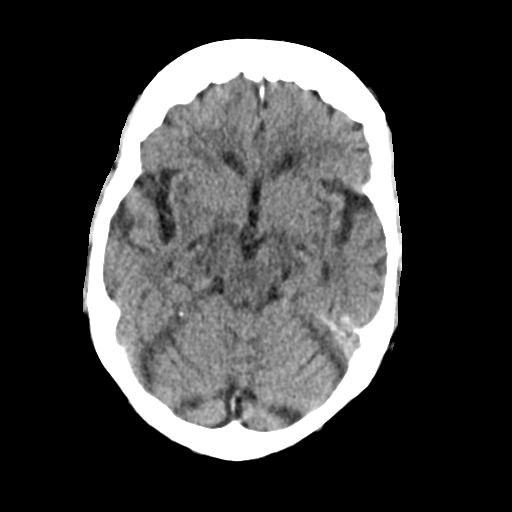
[im 12/28  brain]
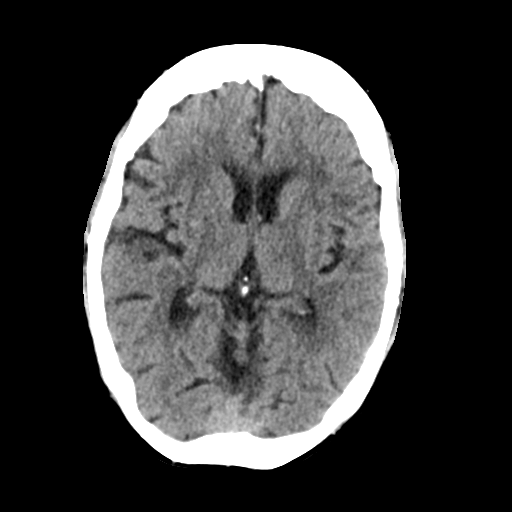
[im 15/28  brain]
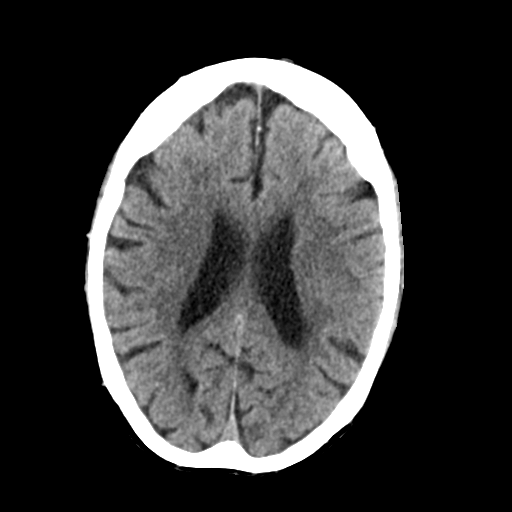
[im 15/28  bone]
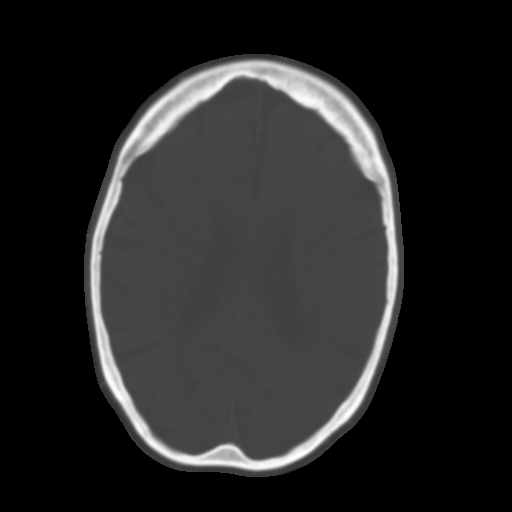
[im 17/28  brain]
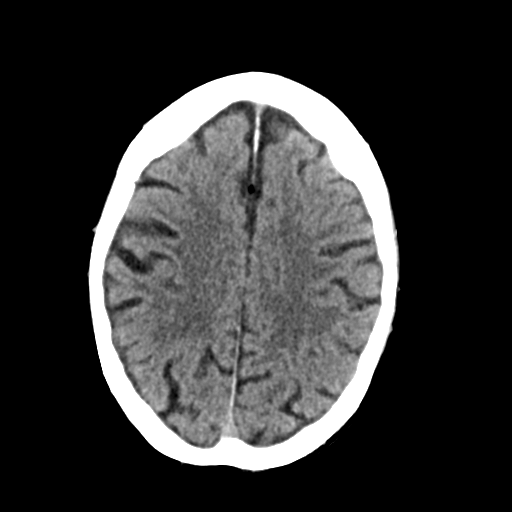
[im 20/28  brain]
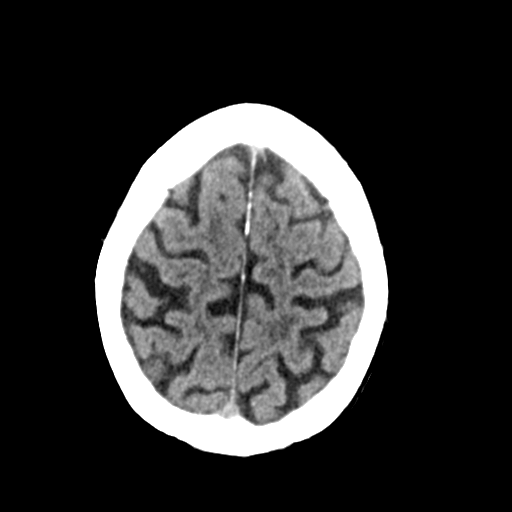
[im 23/28  brain]
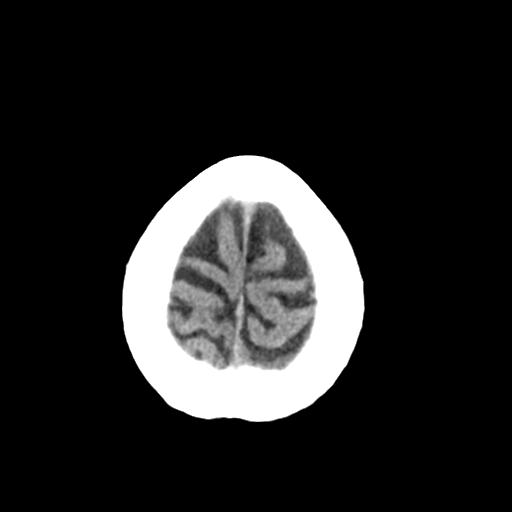
[im 26/28  brain]
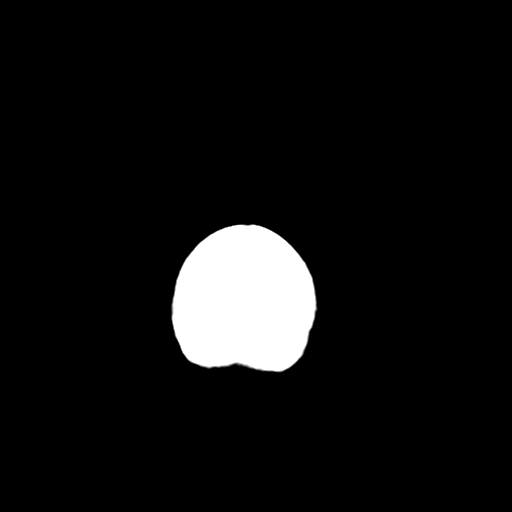
[im 26/28  bone]
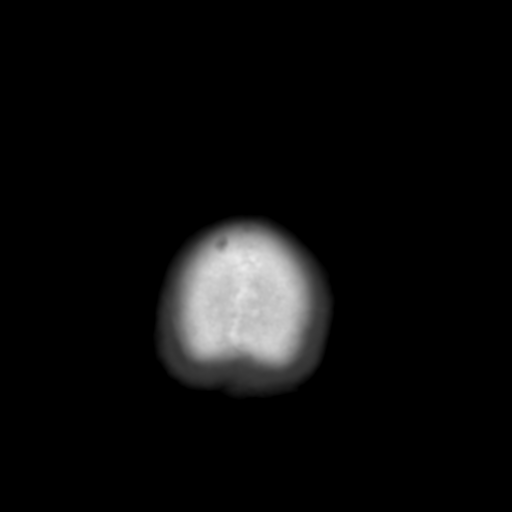

[Series 4: coronal soft tissue · coronal · 0.27mm/px · 3 of 59 slices shown]
[im 20/59  brain]
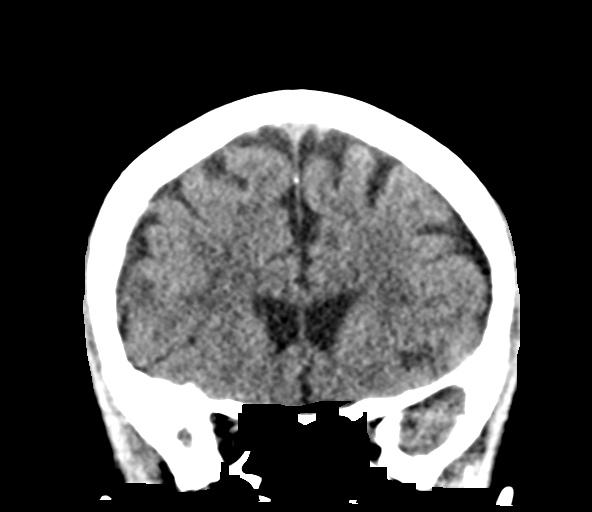
[im 26/59  brain]
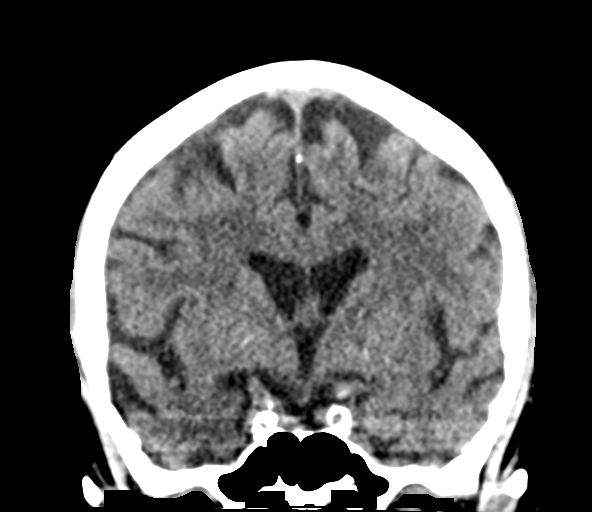
[im 33/59  brain]
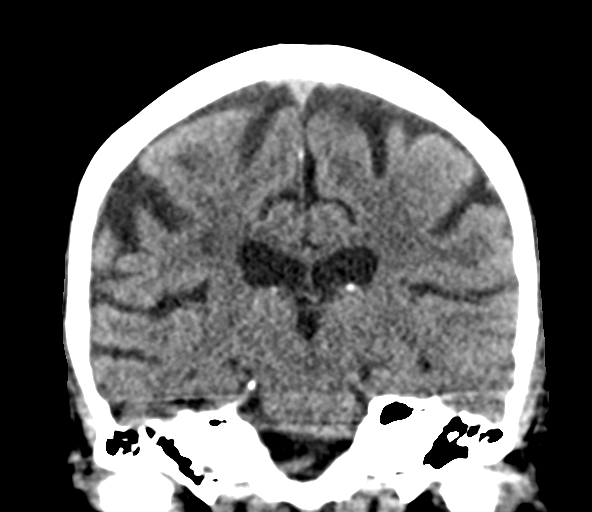

[Series 5: sagittal soft tissue · sagittal · 0.27mm/px · 3 of 46 slices shown]
[im 16/46  brain]
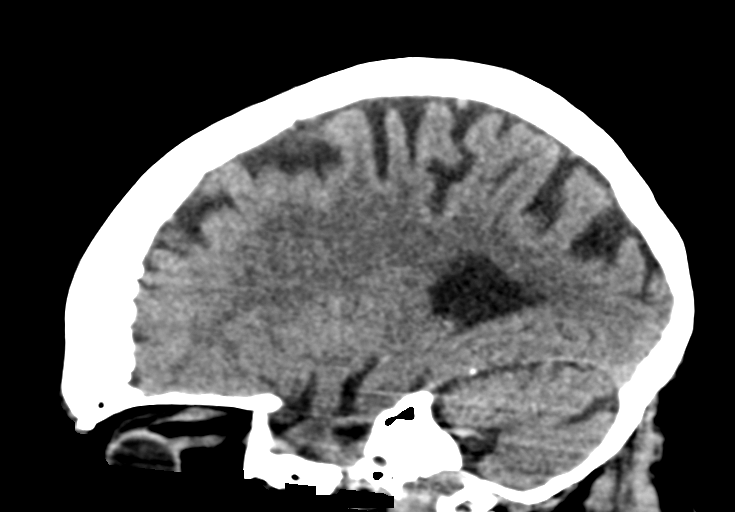
[im 23/46  brain]
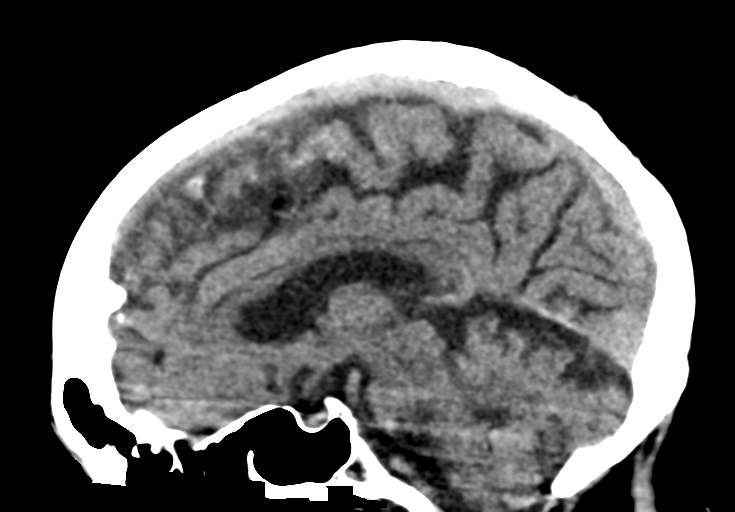
[im 31/46  brain]
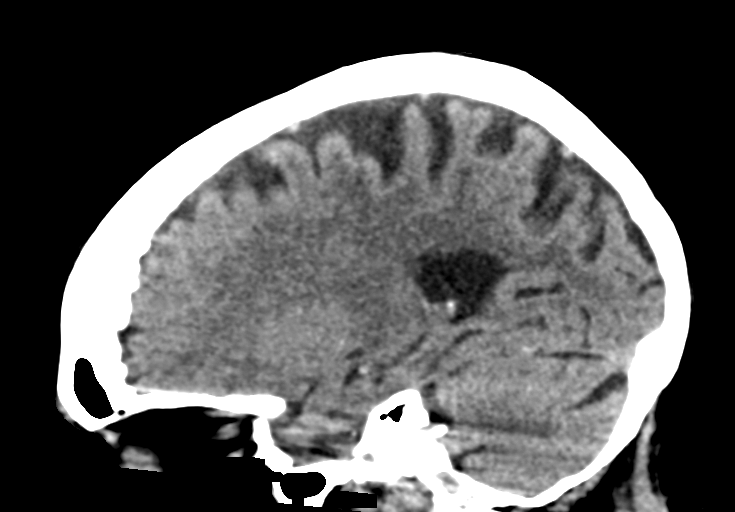

[15 of 45 positions shown; findings below may reference images not displayed]

FINDINGS: Atrophy and chronic microvascular ischemic change are seen. No
evidence of acute intracranial abnormality including hemorrhage,
infarct, mass lesion, mass effect, midline shift or abnormal
extra-axial fluid collection. No hydrocephalus pneumocephalus. The
calvarium is intact. Imaged paranasal sinuses and mastoid air cells
are clear.
IMPRESSION: No acute abnormality.

Atrophy and chronic microvascular ischemic change.
